# Patient Record
Sex: Male | Born: 1950 | Race: Black or African American | Hispanic: No | Smoking: Former smoker
Health system: Southern US, Community
[De-identification: ages and names within clinical notes are randomized; demographics above are authoritative.]

## PROBLEM LIST (undated history)

## (undated) DIAGNOSIS — I340 Nonrheumatic mitral (valve) insufficiency: Secondary | ICD-10-CM

## (undated) DIAGNOSIS — J329 Chronic sinusitis, unspecified: Secondary | ICD-10-CM

## (undated) DIAGNOSIS — E88819 Insulin resistance, unspecified: Secondary | ICD-10-CM

## (undated) DIAGNOSIS — Z8546 Personal history of malignant neoplasm of prostate: Secondary | ICD-10-CM

## (undated) DIAGNOSIS — I10 Essential (primary) hypertension: Secondary | ICD-10-CM

## (undated) DIAGNOSIS — I493 Ventricular premature depolarization: Secondary | ICD-10-CM

## (undated) DIAGNOSIS — M1990 Unspecified osteoarthritis, unspecified site: Secondary | ICD-10-CM

## (undated) DIAGNOSIS — F439 Reaction to severe stress, unspecified: Secondary | ICD-10-CM

## (undated) DIAGNOSIS — H1010 Acute atopic conjunctivitis, unspecified eye: Secondary | ICD-10-CM

## (undated) DIAGNOSIS — E8881 Metabolic syndrome: Secondary | ICD-10-CM

## (undated) HISTORY — DX: Insulin resistance, unspecified: E88.819

## (undated) HISTORY — DX: Essential (primary) hypertension: I10

## (undated) HISTORY — DX: Personal history of malignant neoplasm of prostate: Z85.46

## (undated) HISTORY — DX: Ventricular premature depolarization: I49.3

## (undated) HISTORY — DX: Reaction to severe stress, unspecified: F43.9

## (undated) HISTORY — DX: Acute atopic conjunctivitis, unspecified eye: H10.10

## (undated) HISTORY — DX: Metabolic syndrome: E88.81

## (undated) HISTORY — DX: Chronic sinusitis, unspecified: J32.9

## (undated) HISTORY — DX: Nonrheumatic mitral (valve) insufficiency: I34.0

## (undated) HISTORY — DX: Unspecified osteoarthritis, unspecified site: M19.90

---

## 1997-10-30 ENCOUNTER — Emergency Department (HOSPITAL_COMMUNITY): Admission: EM | Admit: 1997-10-30 | Discharge: 1997-10-30 | Payer: Self-pay | Admitting: Emergency Medicine

## 2006-09-18 ENCOUNTER — Emergency Department (HOSPITAL_COMMUNITY): Admission: EM | Admit: 2006-09-18 | Discharge: 2006-09-18 | Payer: Self-pay | Admitting: Emergency Medicine

## 2006-09-19 ENCOUNTER — Ambulatory Visit: Payer: Self-pay | Admitting: Vascular Surgery

## 2006-09-19 ENCOUNTER — Ambulatory Visit (HOSPITAL_COMMUNITY): Admission: RE | Admit: 2006-09-19 | Discharge: 2006-09-19 | Payer: Self-pay | Admitting: Emergency Medicine

## 2008-12-06 ENCOUNTER — Encounter: Admission: RE | Admit: 2008-12-06 | Discharge: 2008-12-06 | Payer: Self-pay | Admitting: Internal Medicine

## 2010-02-28 ENCOUNTER — Other Ambulatory Visit: Payer: Self-pay | Admitting: Internal Medicine

## 2010-02-28 ENCOUNTER — Ambulatory Visit (HOSPITAL_COMMUNITY)
Admission: RE | Admit: 2010-02-28 | Discharge: 2010-02-28 | Payer: Self-pay | Source: Home / Self Care | Attending: Internal Medicine | Admitting: Internal Medicine

## 2011-03-09 ENCOUNTER — Other Ambulatory Visit: Payer: Self-pay | Admitting: Internal Medicine

## 2011-07-04 ENCOUNTER — Other Ambulatory Visit: Payer: Self-pay | Admitting: Internal Medicine

## 2012-07-06 ENCOUNTER — Encounter: Payer: Self-pay | Admitting: Hematology

## 2013-07-15 ENCOUNTER — Other Ambulatory Visit: Payer: Self-pay | Admitting: Urology

## 2013-07-15 DIAGNOSIS — R972 Elevated prostate specific antigen [PSA]: Secondary | ICD-10-CM

## 2013-07-30 ENCOUNTER — Other Ambulatory Visit (HOSPITAL_COMMUNITY): Payer: Self-pay

## 2013-08-06 ENCOUNTER — Ambulatory Visit (HOSPITAL_COMMUNITY)
Admission: RE | Admit: 2013-08-06 | Discharge: 2013-08-06 | Disposition: A | Discharge status: Home / Self Care | Payer: BC Managed Care – PPO | Source: Ambulatory Visit | Attending: Urology | Admitting: Urology

## 2013-08-06 DIAGNOSIS — R972 Elevated prostate specific antigen [PSA]: Secondary | ICD-10-CM

## 2013-08-06 DIAGNOSIS — N400 Enlarged prostate without lower urinary tract symptoms: Secondary | ICD-10-CM | POA: Insufficient documentation

## 2013-08-06 LAB — CREATININE, SERUM
Creatinine, Ser: 1.09 mg/dL (ref 0.50–1.35)
GFR calc non Af Amer: 70 mL/min — ABNORMAL LOW (ref >90)
GFR, EST AFRICAN AMERICAN: 81 mL/min — AB (ref >90)

## 2013-08-06 MED ORDER — GADOBENATE DIMEGLUMINE 529 MG/ML IV SOLN
17.0000 mL | Freq: Once | INTRAVENOUS | Status: AC | PRN
  Administered 2013-08-06: 17 mL via INTRAVENOUS

## 2015-04-17 ENCOUNTER — Ambulatory Visit (HOSPITAL_COMMUNITY): Payer: Managed Care, Other (non HMO)

## 2017-05-16 ENCOUNTER — Other Ambulatory Visit: Payer: Self-pay | Admitting: Urology

## 2019-03-17 ENCOUNTER — Ambulatory Visit: Payer: Managed Care, Other (non HMO) | Attending: Internal Medicine

## 2019-03-17 DIAGNOSIS — Z23 Encounter for immunization: Secondary | ICD-10-CM | POA: Diagnosis not present

## 2019-03-17 NOTE — Progress Notes (Signed)
   Covid-19 Vaccination Clinic  Name:  Gabriel Benson    MRN: [id number] DOB: 05/04/1950  03/17/2019  Mr. Gabriel Benson was observed post Covid-19 immunization for 15 minutes without incidence. He was provided with Vaccine Information Sheet and instruction to access the V-Safe system.   Mr. Gabriel Benson was instructed to call 911 with any severe reactions post vaccine: Marland Kitchen Difficulty breathing  . Swelling of your face and throat  . A fast heartbeat  . A bad rash all over your body  . Dizziness and weakness    Immunizations Administered    Name Date Dose VIS Date Route   Pfizer COVID-19 Vaccine 03/17/2019 10:53 AM 0.3 mL 02/05/2019 Intramuscular   Manufacturer: [employer], [employer]   Lot: [id number]   NDC: [address]-2

## 2019-04-07 ENCOUNTER — Ambulatory Visit: Payer: Managed Care, Other (non HMO) | Attending: Internal Medicine

## 2019-04-07 DIAGNOSIS — Z23 Encounter for immunization: Secondary | ICD-10-CM | POA: Insufficient documentation

## 2019-04-07 NOTE — Progress Notes (Signed)
   Covid-19 Vaccination Clinic  Name:  Gabriel Benson    MRN: [id number] DOB: 1950-06-20  04/07/2019  Gabriel Benson was observed post Covid-19 immunization for 15 minutes without incidence. He was provided with Vaccine Information Sheet and instruction to access the V-Safe system.   Gabriel Benson was instructed to call 911 with any severe reactions post vaccine: Marland Kitchen Difficulty breathing  . Swelling of your face and throat  . A fast heartbeat  . A bad rash all over your body  . Dizziness and weakness    Immunizations Administered    Name Date Dose VIS Date Route   Pfizer COVID-19 Vaccine 04/07/2019  2:36 PM 0.3 mL 02/05/2019 Intramuscular   Manufacturer: [employer], [employer]   Lot: [id number]   NDC: [address]-8

## 2019-05-07 ENCOUNTER — Other Ambulatory Visit: Payer: Self-pay | Admitting: Internal Medicine

## 2019-05-07 DIAGNOSIS — I714 Abdominal aortic aneurysm, without rupture, unspecified: Secondary | ICD-10-CM

## 2019-05-19 ENCOUNTER — Other Ambulatory Visit: Payer: Self-pay | Admitting: Internal Medicine

## 2019-05-19 ENCOUNTER — Ambulatory Visit
Admission: RE | Admit: 2019-05-19 | Discharge: 2019-05-19 | Disposition: A | Discharge status: Home / Self Care | Payer: Managed Care, Other (non HMO) | Source: Ambulatory Visit | Attending: Internal Medicine | Admitting: Internal Medicine

## 2019-05-19 DIAGNOSIS — I714 Abdominal aortic aneurysm, without rupture, unspecified: Secondary | ICD-10-CM

## 2019-09-03 ENCOUNTER — Other Ambulatory Visit: Payer: Self-pay | Admitting: Internal Medicine

## 2019-09-03 ENCOUNTER — Ambulatory Visit
Admission: RE | Admit: 2019-09-03 | Discharge: 2019-09-03 | Disposition: A | Discharge status: Home / Self Care | Payer: Managed Care, Other (non HMO) | Source: Ambulatory Visit | Attending: Internal Medicine | Admitting: Internal Medicine

## 2019-09-03 ENCOUNTER — Other Ambulatory Visit: Payer: Self-pay

## 2019-09-03 DIAGNOSIS — M542 Cervicalgia: Secondary | ICD-10-CM

## 2019-09-03 DIAGNOSIS — M25551 Pain in right hip: Secondary | ICD-10-CM

## 2021-07-09 ENCOUNTER — Encounter: Payer: Self-pay | Admitting: Cardiology

## 2021-07-09 ENCOUNTER — Ambulatory Visit: Payer: Medicare HMO | Admitting: Cardiology

## 2021-07-09 VITALS — BP 127/85 | HR 77 | Temp 98.4°F | Resp 16 | Ht 69.0 in | Wt 185.0 lb

## 2021-07-09 DIAGNOSIS — R7303 Prediabetes: Secondary | ICD-10-CM

## 2021-07-09 DIAGNOSIS — R011 Cardiac murmur, unspecified: Secondary | ICD-10-CM

## 2021-07-09 DIAGNOSIS — I10 Essential (primary) hypertension: Secondary | ICD-10-CM

## 2021-07-09 DIAGNOSIS — E782 Mixed hyperlipidemia: Secondary | ICD-10-CM

## 2021-07-09 NOTE — Progress Notes (Signed)
? ?ID:  Gabriel Benson, DOB [birth year], MRN [id number] ? ?PCP:  Gwenyth Bender, MD  ?Cardiologist:  Tessa Lerner, DO, [facility] (established care Jul 09, 2021) ? ? ?REASON FOR CONSULT: Cardiac murmur ? ?REQUESTING PHYSICIAN:  ?Gwenyth Bender, MD ?[address] ?[address] ?[place],  [place] [address] ? ?Chief Complaint  ?Patient presents with  ? Heart Murmur  ? New Patient (Initial Visit)  ? ? ?HPI  ?Gabriel Benson is a 71 y.o. African-American male who presents to the clinic for evaluation of cardiac murmur at the request of Gwenyth Bender, MD. His past medical history and cardiovascular risk factors include: Hypertension, mitral regurgitation, prediabetes, hyperlipidemia.  ? ?Patient presents to the office today to be evaluated for cardiac murmur at the request of his PCP.  He has no prior recollection of having a cardiac murmur.  However, review of prior records from 2012 notes that he was noted to have mild MR on surface echocardiogram.  He ambulates on a daily basis 1-1.5 miles without any exertional chest pain or heart failure symptoms. ? ?His prior LVEF was documented to be 45-50%.  He is currently not on heart failure medications and does not endorse such comorbid conditions in the past.  He is in the process of obtaining [facility] coverage as well. ? ?FUNCTIONAL STATUS: ?Walks 1-1.5 miles regularly 5 days a week. ? ?ALLERGIES: ?No Known Allergies ? ?MEDICATION LIST PRIOR TO VISIT: ?Current Meds  ?Medication Sig  ? Acetaminophen (TYLENOL ARTHRITIS PAIN PO) Take by mouth.  ? aspirin 325 MG tablet Take 325 mg by mouth daily.  ? Cyanocobalamin (VITAMIN B-12 PO) Take by mouth.  ? FELODIPINE ER PO Take by mouth daily.  ? Fexofenadine HCl (ALLEGRA PO) Take by mouth.  ? GLUCOSAMINE PO Take by mouth.  ? magnesium oxide (MAG-OX) 400 (240 Mg) MG tablet Take 1 tablet by mouth 2 (two) times daily as needed.  ? omeprazole (PRILOSEC) 20 MG capsule Take 20 mg by mouth daily.  ? terazosin (HYTRIN) 5 MG capsule Take 5 mg by mouth at bedtime.   ? TRIAMTERENE-HCTZ PO Take by mouth daily.  ?  ? ?PAST MEDICAL HISTORY: ?Past Medical History:  ?Diagnosis Date  ? Allergic conjunctivitis   ? DJD (degenerative joint disease)   ? H/O prostate cancer   ? Hypertension   ? Insulin resistance   ? Mitral valve regurgitation   ? Sinusitis   ? Situational stress   ? ? ?PAST SURGICAL HISTORY: ?History reviewed. No pertinent surgical history. ? ?FAMILY HISTORY: ?The patient family history includes Hypertension in his father. ? ?SOCIAL HISTORY:  ?The patient  reports that he has quit smoking. His smoking use included cigarettes. He has a 1.00 pack-year smoking history. He has never used smokeless tobacco. He reports current alcohol use. He reports that he does not use drugs. ? ?REVIEW OF SYSTEMS: ?Review of Systems  ?Cardiovascular:  Negative for chest pain, claudication, dyspnea on exertion, leg swelling, near-syncope, orthopnea, palpitations, paroxysmal nocturnal dyspnea and syncope.  ?Respiratory:  Negative for shortness of breath.   ?Hematologic/Lymphatic: Negative for bleeding problem.  ? ?PHYSICAL EXAM: ? ?  07/09/2021  ? 12:26 PM 05/18/2012  ?  7:50 PM 11/18/2011  ?  7:51 PM  ?Vitals with BMI  ?Height 5\' 9"     ?Weight 185 lbs  187 lbs  ?BMI 27.31    ?Systolic 127 124  ?Diastolic 85 83 85  ?Pulse 77  70  ? ? ?CONSTITUTIONAL: Well-developed and well-nourished. No  acute distress.  ?SKIN: Skin is warm and dry. No rash noted. No cyanosis. No pallor. No jaundice ?HEAD: Normocephalic and atraumatic.  ?EYES: No scleral icterus ?MOUTH/THROAT: Moist oral membranes.  ?NECK: No JVD present. No thyromegaly noted. No carotid bruits  ?LYMPHATIC: No visible cervical adenopathy.  ?CHEST Normal respiratory effort. No intercostal retractions  ?LUNGS: Clear to auscultation bilaterally.  No stridor. No wheezes. No rales.  ?CARDIOVASCULAR: Regular rate and rhythm, positive S1-S2, soft systolic murmur, no rubs or gallops appreciated. ?ABDOMINAL: ?Obese, soft, nontender, nondistended,  positive bowel sounds in all 4 quadrants, no apparent ascites.  ?EXTREMITIES: No peripheral edema, warm to touch, 2+ bilateral DP and PT pulses ?HEMATOLOGIC: No significant bruising ?NEUROLOGIC: Oriented to person, place, and time. Nonfocal. Normal muscle tone.  ?PSYCHIATRIC: Normal mood and affect. Normal behavior. Cooperative ? ?CARDIAC DATABASE: ?EKG: ?07/09/2021: Normal sinus rhythm, 70 bpm, without underlying injury pattern. ? ?Echocardiogram: ?January 2012: ?LVEF 45-50%, grade 1 diastolic impairment, mild MR, mildly dilated right ventricle. ? ?Stress Testing: ?No results found for this or any previous visit from the past 1095 days. ? ?Heart Catheterization: ?None ? ?LABORATORY DATA: ?External Labs: ?Collected: Jun 25, 2021 provided by PCP ?Total cholesterol 195, HDL 53, triglycerides 137, LDL 117, non-HDL 142 ?BUN 10, creatinine 0.99 ?Sodium 141, potassium 4, chloride 103, bicarb 29 ?AST 24, ALT 22, alkaline phosphatase 48 ?Hemoglobin 15 g/dL, hematocrit 09%45% ? ?Collected: 02/02/2021 ?NT proBNP 27. ?Total cholesterol 211, HDL 63, triglycerides 98, LDL 128, non-HDL 148 ? ? ?IMPRESSION: ? ?  ICD-10-CM   ?1. Heart murmur  R01.1 EKG 12-Lead  ?  PCV ECHOCARDIOGRAM COMPLETE  ?  ?2. Benign hypertension  I10   ?  ?3. Mixed hyperlipidemia  E78.2 CT CARDIAC SCORING ([facility] LOCATIONS ONLY)  ?  ?4. Prediabetes  R73.03   ?  ?  ? ?RECOMMENDATIONS: ?Gabriel Benson is a 71 y.o. African-American male whose past medical history and cardiac risk factors include: Hypertension, mitral regurgitation, prediabetes, hyperlipidemia.  ? ?Patient referred to the office for evaluation of cardiac murmur which is evident on physical examination.  Recommend echocardiogram to evaluate LVEF, diastolic function and valvular heart disease. ? ?Today's EKG is nonischemic results reviewed with the patient as well. ? ?Given the patient's cholesterol levels and estimated 10-year risk of ASCVD of 19.5% recommended considering cholesterol medications.   Patient would like to discuss this further with PCP which is very reasonable.  In the meantime we discussed considering coronary artery calcium score as a risk stratification tool.  Patient agrees. ? ?Denies anginal discomfort or heart failure symptoms.  Patient has good functional capacity for age.  We will hold off on ischemic work-up for now based on the objective data available.  Patient is agreeable with the plan of care. ? ?Patient states that he will have the diagnostic work-up performed in August 2023 as he has upcoming vacations planned. ? ?As part of this consultation reviewed prior echo reports, office notes and labs provided by PCP independently reviewed and summarized above for reference, ordered and independently reviewed EKG, plan of care discussed and additional diagnostic work-up ordered as noted below. ? ?FINAL MEDICATION LIST END OF ENCOUNTER: ?No orders of the defined types were placed in this encounter. ?  ?Medications Discontinued During This Encounter  ?Medication Reason  ? MAGNESIUM PO   ? TERAZOSIN HCL PO   ? terazosin (HYTRIN) 10 MG capsule   ?  ? ?Current Outpatient Medications:  ?  Acetaminophen (TYLENOL ARTHRITIS PAIN PO), Take by mouth., Disp: ,  Rfl:  ?  aspirin 325 MG tablet, Take 325 mg by mouth daily., Disp: , Rfl:  ?  Cyanocobalamin (VITAMIN B-12 PO), Take by mouth., Disp: , Rfl:  ?  FELODIPINE ER PO, Take by mouth daily., Disp: , Rfl:  ?  Fexofenadine HCl (ALLEGRA PO), Take by mouth., Disp: , Rfl:  ?  GLUCOSAMINE PO, Take by mouth., Disp: , Rfl:  ?  magnesium oxide (MAG-OX) 400 (240 Mg) MG tablet, Take 1 tablet by mouth 2 (two) times daily as needed., Disp: , Rfl:  ?  omeprazole (PRILOSEC) 20 MG capsule, Take 20 mg by mouth daily., Disp: , Rfl:  ?  terazosin (HYTRIN) 5 MG capsule, Take 5 mg by mouth at bedtime., Disp: , Rfl:  ?  TRIAMTERENE-HCTZ PO, Take by mouth daily., Disp: , Rfl:  ? ?Orders Placed This Encounter  ?Procedures  ? CT CARDIAC SCORING ([facility] LOCATIONS ONLY)  ? EKG  12-Lead  ? PCV ECHOCARDIOGRAM COMPLETE  ? ? ?There are no Patient Instructions on file for this visit.  ? ?--Continue cardiac medications as reconciled in final medication list. ?--Return in about 3 months (aroun

## 2021-08-20 ENCOUNTER — Ambulatory Visit
Admission: RE | Admit: 2021-08-20 | Discharge: 2021-08-20 | Disposition: A | Discharge status: Home / Self Care | Payer: No Typology Code available for payment source | Source: Ambulatory Visit | Attending: Cardiology | Admitting: Cardiology

## 2021-08-20 DIAGNOSIS — E782 Mixed hyperlipidemia: Secondary | ICD-10-CM

## 2021-08-29 ENCOUNTER — Telehealth: Payer: Self-pay

## 2021-09-03 NOTE — Telephone Encounter (Signed)
Called the patient and discussed his coronary calcium report. He is made aware that the total calcium score is 0. He does have a pulmonary nodule which is stable compared to his prior studies.   Further work-up is less likely but I still have asked him to discuss it with his PCP.  Kato Wieczorek [place], DO, [facility]

## 2021-10-01 ENCOUNTER — Other Ambulatory Visit: Payer: Medicare HMO

## 2021-10-05 ENCOUNTER — Ambulatory Visit: Payer: Medicare HMO

## 2021-10-05 DIAGNOSIS — R011 Cardiac murmur, unspecified: Secondary | ICD-10-CM

## 2021-10-12 ENCOUNTER — Ambulatory Visit: Payer: Medicare HMO | Admitting: Cardiology

## 2021-10-12 ENCOUNTER — Encounter: Payer: Self-pay | Admitting: Cardiology

## 2021-10-12 VITALS — BP 134/85 | HR 58 | Temp 98.0°F | Resp 16 | Ht 69.0 in | Wt 185.0 lb

## 2021-10-12 DIAGNOSIS — I10 Essential (primary) hypertension: Secondary | ICD-10-CM

## 2021-10-12 DIAGNOSIS — I700 Atherosclerosis of aorta: Secondary | ICD-10-CM

## 2021-10-12 DIAGNOSIS — I340 Nonrheumatic mitral (valve) insufficiency: Secondary | ICD-10-CM

## 2021-10-12 DIAGNOSIS — E782 Mixed hyperlipidemia: Secondary | ICD-10-CM

## 2021-10-12 DIAGNOSIS — R7303 Prediabetes: Secondary | ICD-10-CM

## 2021-10-12 NOTE — Progress Notes (Signed)
ID:  Gabriel Benson, DOB [birth year], MRN [id number]  PCP:  Gwenyth Bender, MD  Cardiologist:  Tessa Lerner, DO, [facility] (established care Jul 09, 2021)  Date: 10/12/21 Last Office Visit: 07/09/2021  Chief Complaint  Patient presents with   Follow-up    3 month   Results    Lab result    HPI  Gabriel Benson is a 71 y.o. African-American male whose past medical history and cardiovascular risk factors include: Aortic atherosclerosis, hypertension, mitral regurgitation, prediabetes, hyperlipidemia.   Patient was referred to the practice for evaluation of a cardiac murmur.  At the last office visit the shared decision was to proceed with echocardiogram to evaluate for LVEF, valvular heart disease, and diastolic function.  In addition given his elevated 10-year risk of ASCVD and LDL levels he was recommended to be on statin therapy but wanted to discuss it further with PCP.  For further risk stratification shared decision was to proceed with coronary calcium score.  Since last office visit he has not had any anginal discomfort or heart failure symptoms.  He was started on Crestor 5 mg p.o. nightly by his PCP. Total coronary calcium score is 0.  Patient is noted to have preserved LVEF with mild to moderate valvular heart disease.  FUNCTIONAL STATUS: Walks 1-1.5 miles regularly 5 days a week.  ALLERGIES: No Known Allergies  MEDICATION LIST PRIOR TO VISIT: Current Meds  Medication Sig   Acetaminophen (TYLENOL ARTHRITIS PAIN PO) Take by mouth.   Cyanocobalamin (VITAMIN B-12 PO) Take by mouth.   FELODIPINE ER PO Take by mouth daily.   Fexofenadine HCl (ALLEGRA PO) Take by mouth.   finasteride (PROSCAR) 5 MG tablet Take 5 mg by mouth daily.   GLUCOSAMINE PO Take by mouth.   magnesium oxide (MAG-OX) 400 (240 Mg) MG tablet Take 1 tablet by mouth 2 (two) times daily as needed.   omeprazole (PRILOSEC) 20 MG capsule Take 20 mg by mouth daily.   QUEtiapine (SEROQUEL) 25 MG tablet Take 25 mg by  mouth at bedtime.   rosuvastatin (CRESTOR) 5 MG tablet Take 5 mg by mouth daily.   terazosin (HYTRIN) 5 MG capsule Take 5 mg by mouth at bedtime.   TRIAMTERENE-HCTZ PO Take by mouth daily.     PAST MEDICAL HISTORY: Past Medical History:  Diagnosis Date   Allergic conjunctivitis    DJD (degenerative joint disease)    H/O prostate cancer    Hypertension    Insulin resistance    Mitral valve regurgitation    Sinusitis    Situational stress     PAST SURGICAL HISTORY: History reviewed. No pertinent surgical history.  FAMILY HISTORY: The patient family history includes Hypertension in his father.  SOCIAL HISTORY:  The patient  reports that he has quit smoking. His smoking use included cigarettes. He has a 1.00 pack-year smoking history. He has never used smokeless tobacco. He reports current alcohol use. He reports that he does not use drugs.  REVIEW OF SYSTEMS: Review of Systems  Cardiovascular:  Negative for chest pain, claudication, dyspnea on exertion, leg swelling, near-syncope, orthopnea, palpitations, paroxysmal nocturnal dyspnea and syncope.  Respiratory:  Negative for shortness of breath.   Hematologic/Lymphatic: Negative for bleeding problem.    PHYSICAL EXAM:    10/12/2021   10:36 AM 07/09/2021   12:26 PM 05/18/2012    7:50 PM  Vitals with BMI  Height 5\' 9"  5\' 9"    Weight 185 lbs 185 lbs   BMI 27.31 27.31  Systolic 134 127 412  Diastolic 85 85 83  Pulse 58 77     CONSTITUTIONAL: Well-developed and well-nourished. No acute distress.  SKIN: Skin is warm and dry. No rash noted. No cyanosis. No pallor. No jaundice HEAD: Normocephalic and atraumatic.  EYES: No scleral icterus MOUTH/THROAT: Moist oral membranes.  NECK: No JVD present. No thyromegaly noted. No carotid bruits  LYMPHATIC: No visible cervical adenopathy.  CHEST Normal respiratory effort. No intercostal retractions  LUNGS: Clear to auscultation bilaterally.  No stridor. No wheezes. No rales.   CARDIOVASCULAR: Regular rate and rhythm, positive S1-S2, soft systolic murmur, no rubs or gallops appreciated. ABDOMINAL: Obese, soft, nontender, nondistended, positive bowel sounds in all 4 quadrants, no apparent ascites.  EXTREMITIES: No peripheral edema, warm to touch, 2+ bilateral DP and PT pulses HEMATOLOGIC: No significant bruising NEUROLOGIC: Oriented to person, place, and time. Nonfocal. Normal muscle tone.  PSYCHIATRIC: Normal mood and affect. Normal behavior. Cooperative  CARDIAC DATABASE: EKG: 07/09/2021: Normal sinus rhythm, 70 bpm, without underlying injury pattern.  Echocardiogram: January 2012: LVEF 45-50%, grade 1 diastolic impairment, mild MR, mildly dilated right ventricle.  10/05/2021: Normal LV systolic function with visual EF 55-60%. Left ventricle cavity is normal in size. Normal left ventricular wall thickness. Normal global wall motion. Doppler evidence of grade I (impaired) diastolic dysfunction, elevated LAP.  Left atrial cavity is moderately dilated. 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 Mild (Grade I) mitral regurgitation. Moderate tricuspid regurgitation. Mild to moderate pulmonary hypertension. RVSP measures 41 mmHg. Mild pulmonic regurgitation.  Stress Testing: No results found for this or any previous visit from the past 1095 days.  Heart Catheterization: None  CT Cardiac Scoring: 07/2021 Total CAC 0 AU. Noncardiac findings:  7 mm right lower lobe pulmonary nodule, unchanged since 02/16/2018 and considered benign. Aortic Atherosclerosis (ICD10-I70.0).  LABORATORY DATA: External Labs: Collected: Jun 25, 2021 provided by PCP Total cholesterol 195, HDL 53, triglycerides 137, LDL 117, non-HDL 142 BUN 10, creatinine 0.99 Sodium 141, potassium 4, chloride 103, bicarb 29 AST 24, ALT 22, alkaline phosphatase 48 Hemoglobin 15 g/dL, hematocrit 87%  Collected: 02/02/2021 NT proBNP 27. Total cholesterol 211, HDL 63,  triglycerides 98, LDL 128, non-HDL 148   IMPRESSION:    ICD-10-CM   1. Mild mitral regurgitation  I34.0     2. Atherosclerosis of aorta (HCC)  I70.0     3. Benign hypertension  I10     4. Mixed hyperlipidemia  E78.2     5. Prediabetes  R73.03        RECOMMENDATIONS: Gabriel Benson is a 72 y.o. African-American male whose past medical history and cardiac risk factors include: Aortic atherosclerosis, hypertension, mitral regurgitation, prediabetes, hyperlipidemia.   Mild mitral regurgitation Asymptomatic. We will repeat echocardiogram in 3 years for follow-up. Due to limitations of electronic medical records Ordered 3 years out.  Patient is aware of this recommendation and I have asked him to follow through.  Benign hypertension Office blood pressures are well controlled. Medications reconciled. No changes warranted at this time  Mixed hyperlipidemia Currently on rosuvastatin.   He denies myalgia or other side effects. Most recent lipids dated May 2023, independently reviewed as noted above. Currently managed by primary care provider.  Independently reviewed results of the echocardiogram, coronary calcium score, reconciliation of medications, discussed management of at least 2 chronic comorbid conditions.  Shared decision was to follow-up annually after his follow-up visit.  FINAL MEDICATION LIST END OF ENCOUNTER: No orders of the defined types  were placed in this encounter.   Medications Discontinued During This Encounter  Medication Reason   aspirin 325 MG tablet      Current Outpatient Medications:    Acetaminophen (TYLENOL ARTHRITIS PAIN PO), Take by mouth., Disp: , Rfl:    Cyanocobalamin (VITAMIN B-12 PO), Take by mouth., Disp: , Rfl:    FELODIPINE ER PO, Take by mouth daily., Disp: , Rfl:    Fexofenadine HCl (ALLEGRA PO), Take by mouth., Disp: , Rfl:    finasteride (PROSCAR) 5 MG tablet, Take 5 mg by mouth daily., Disp: , Rfl:    GLUCOSAMINE PO, Take by  mouth., Disp: , Rfl:    magnesium oxide (MAG-OX) 400 (240 Mg) MG tablet, Take 1 tablet by mouth 2 (two) times daily as needed., Disp: , Rfl:    omeprazole (PRILOSEC) 20 MG capsule, Take 20 mg by mouth daily., Disp: , Rfl:    QUEtiapine (SEROQUEL) 25 MG tablet, Take 25 mg by mouth at bedtime., Disp: , Rfl:    rosuvastatin (CRESTOR) 5 MG tablet, Take 5 mg by mouth daily., Disp: , Rfl:    terazosin (HYTRIN) 5 MG capsule, Take 5 mg by mouth at bedtime., Disp: , Rfl:    TRIAMTERENE-HCTZ PO, Take by mouth daily., Disp: , Rfl:   No orders of the defined types were placed in this encounter.   There are no Patient Instructions on file for this visit.   --Continue cardiac medications as reconciled in final medication list. --Return in about 10 months (around 08/10/2022) for Annual follow up . or sooner if needed. --Continue follow-up with your primary care physician regarding the management of your other chronic comorbid conditions.  Patient's questions and concerns were addressed to his satisfaction. He voices understanding of the instructions provided during this encounter.   This note was created using a voice recognition software as a result there may be grammatical errors inadvertently enclosed that do not reflect the nature of this encounter. Every attempt is made to correct such errors.  Tessa Lerner, [place], [facility]  Pager: [phone] Office: [phone]

## 2021-12-23 IMAGING — CR DG CERVICAL SPINE COMPLETE 4+V
6 series · 6 of 6 positions shown · non-contrast
Comparison: None.

CLINICAL DATA: Neck pain

EXAM:
CERVICAL SPINE - COMPLETE 4+ VIEW

[w cervical spine lat]
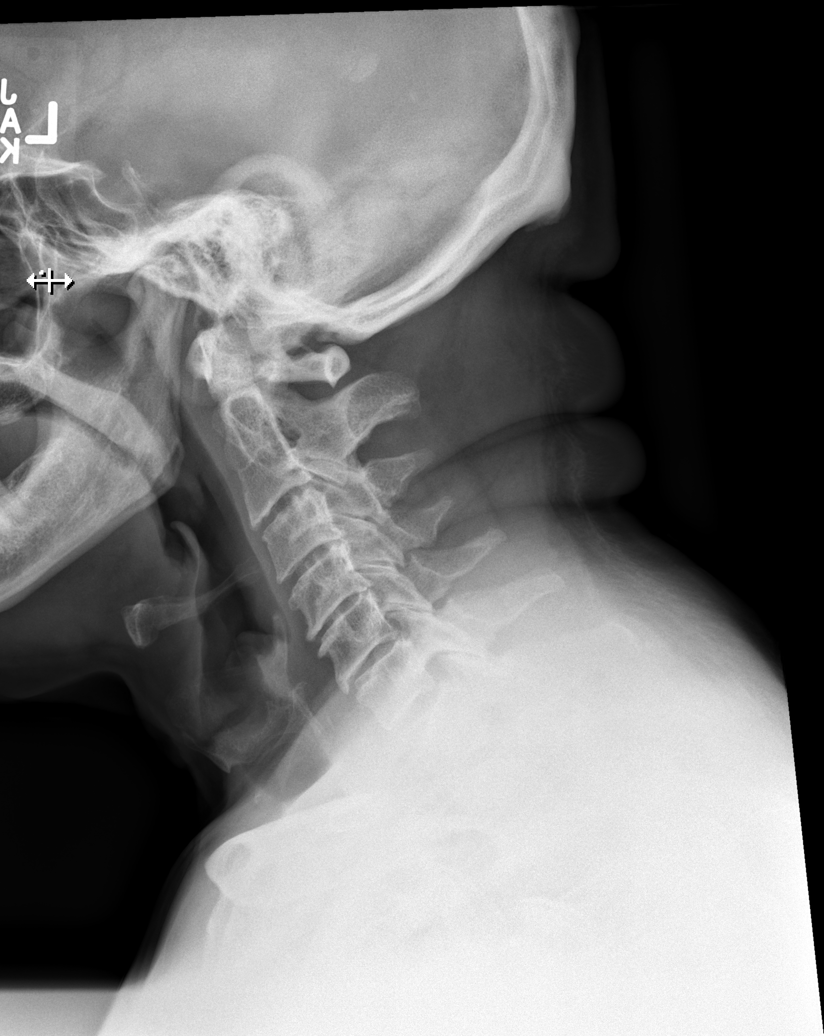

[w cervical spine ap_obl (1 of 2)]
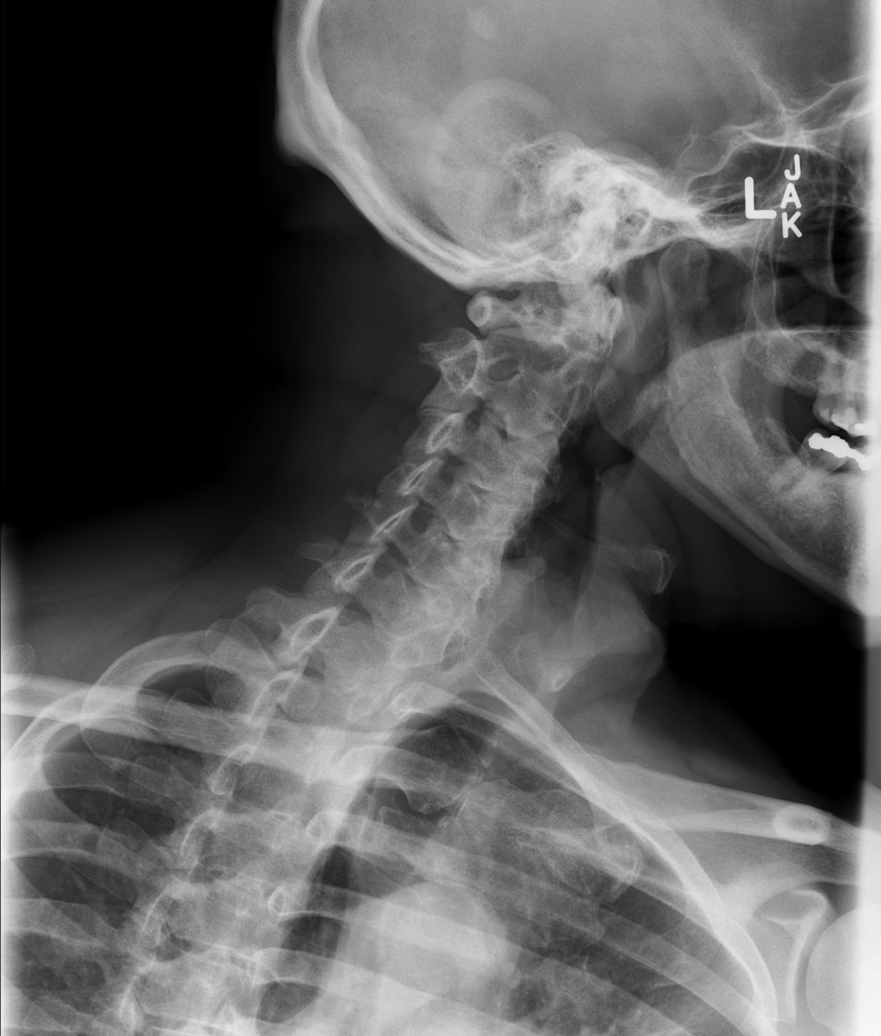

[w cervical spine ap_obl (2 of 2)]
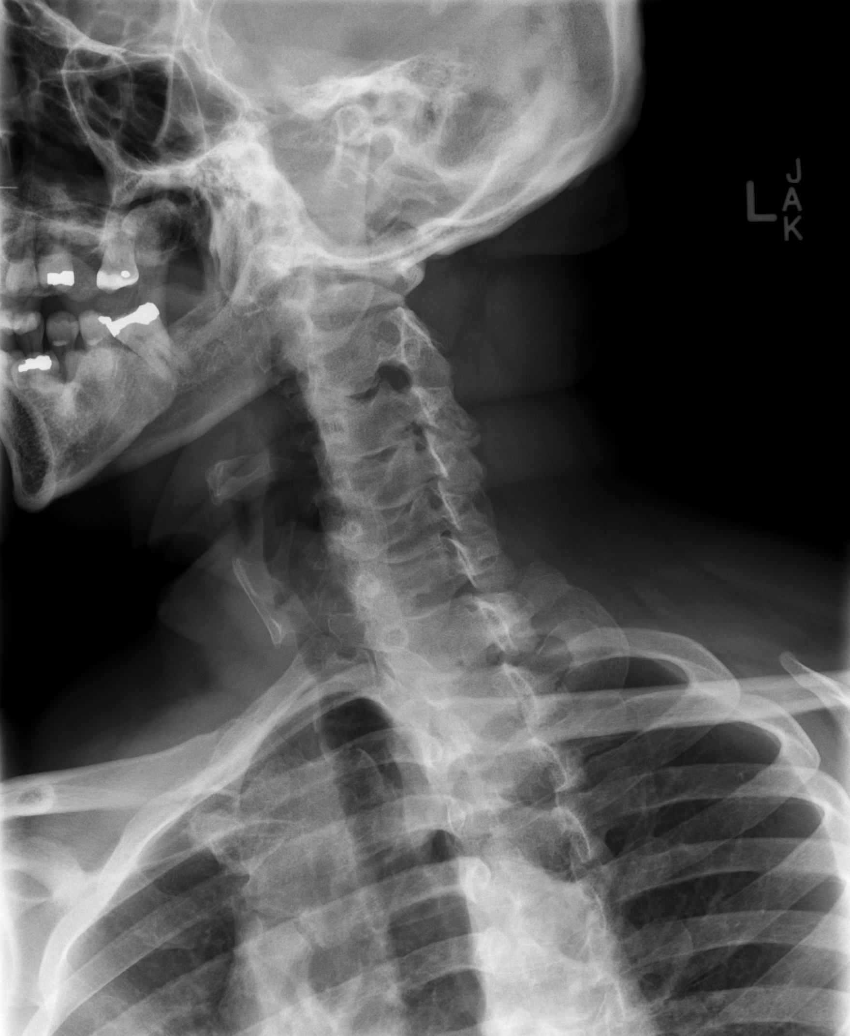

[w cervical spine ap]
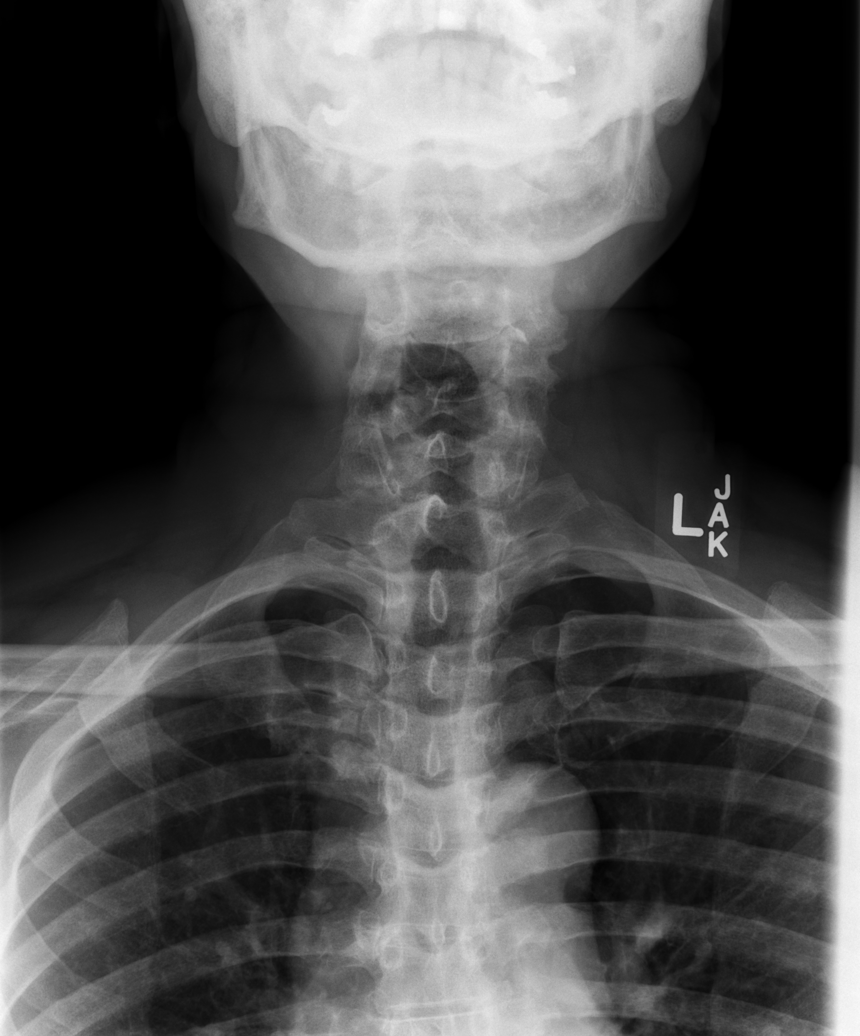

[w cervical spine odontoid (1 of 2)]
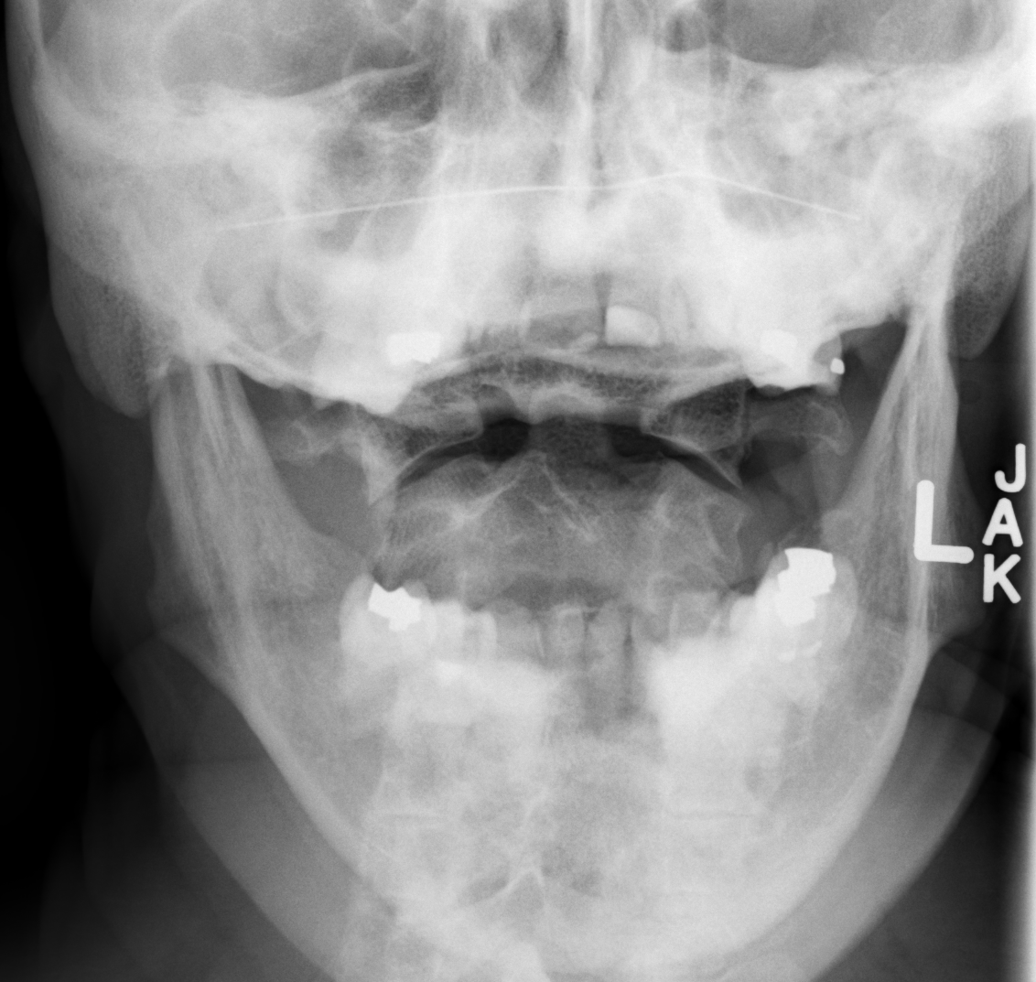

[w cervical spine odontoid (2 of 2)]
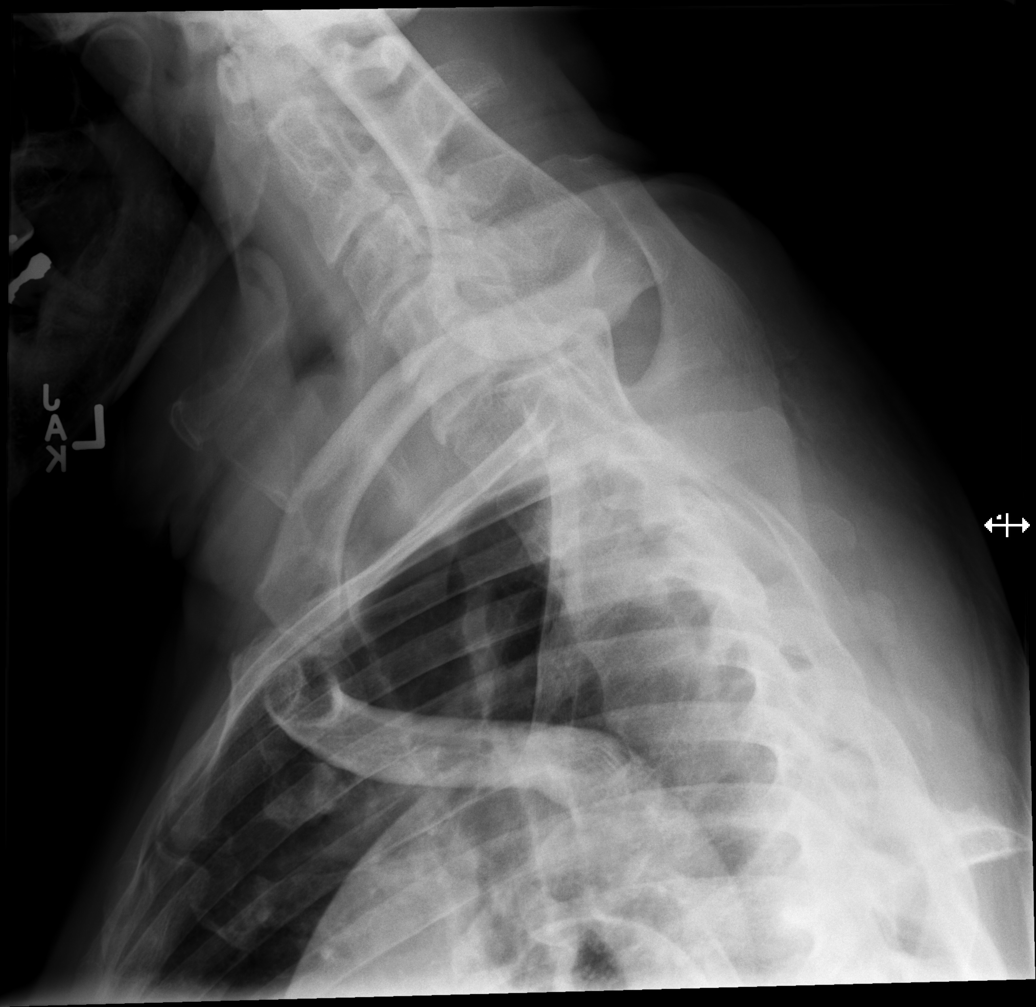

[6 of 6 positions shown; findings below may reference images not displayed]

FINDINGS: There is no prevertebral soft tissue swelling. No acute displaced
fracture. No significant malalignment. Multilevel degenerative
changes are noted throughout the cervical spine resulting in
multilevel bilateral osseous neural foraminal narrowing.
IMPRESSION: No acute displaced fracture or significant malalignment.

## 2021-12-23 IMAGING — CR DG HIP (WITH OR WITHOUT PELVIS) 2-3V*R*
2 series · 2 of 2 positions shown · non-contrast
Comparison: 12/06/2008

CLINICAL DATA: Right hip pain.

EXAM:
DG HIP (WITH OR WITHOUT PELVIS) 2-3V RIGHT

[w hip ap right]
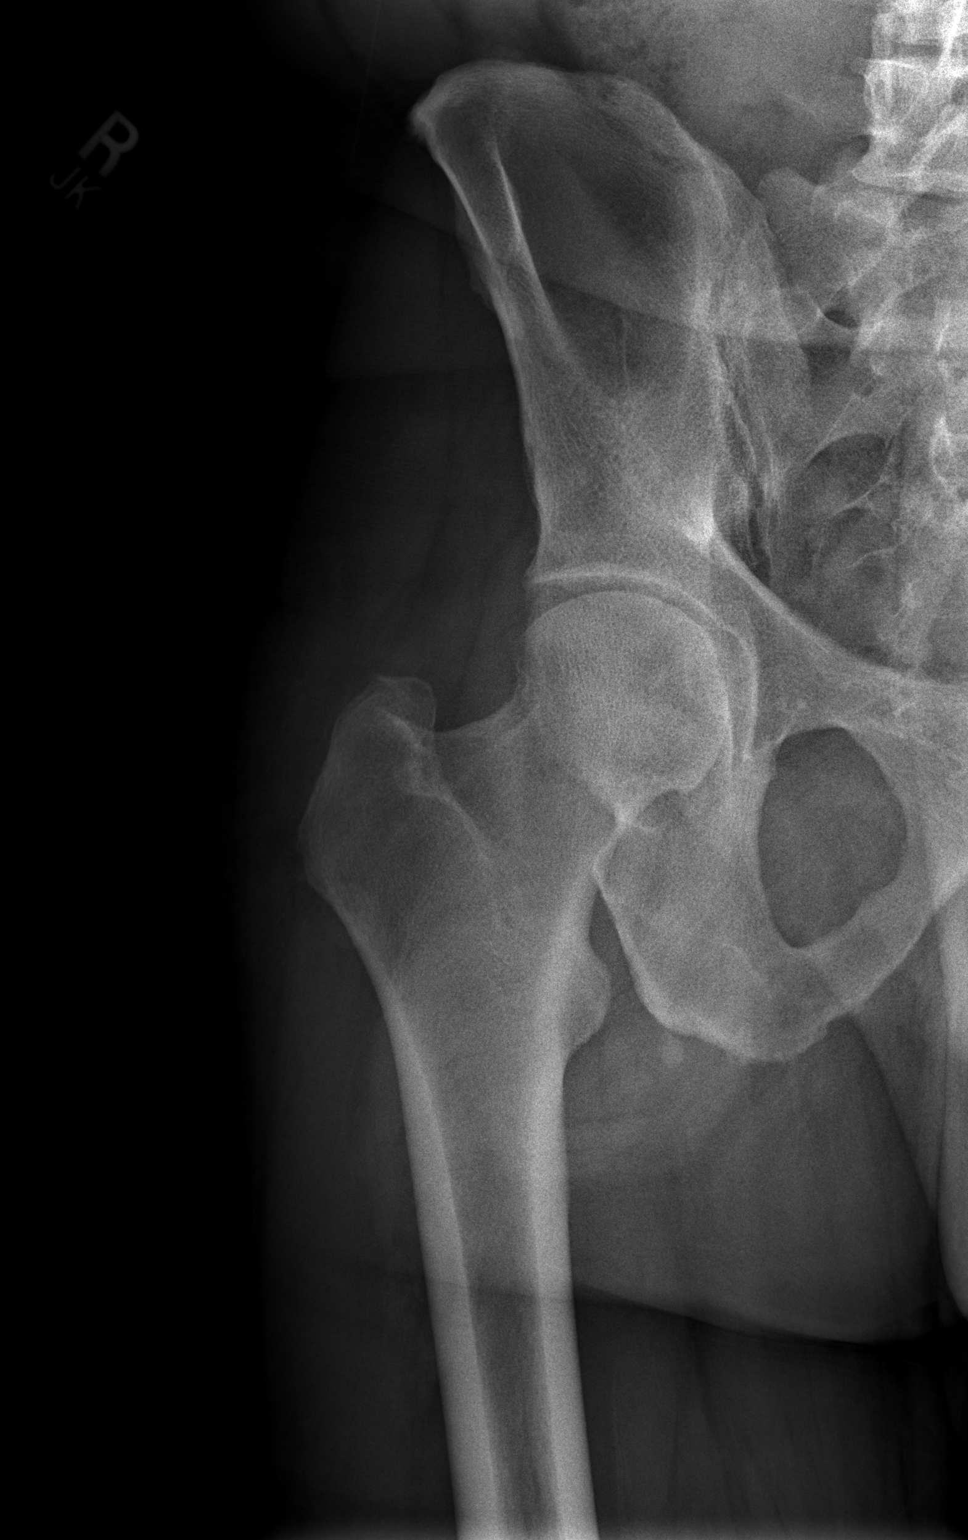

[w hip frog right]
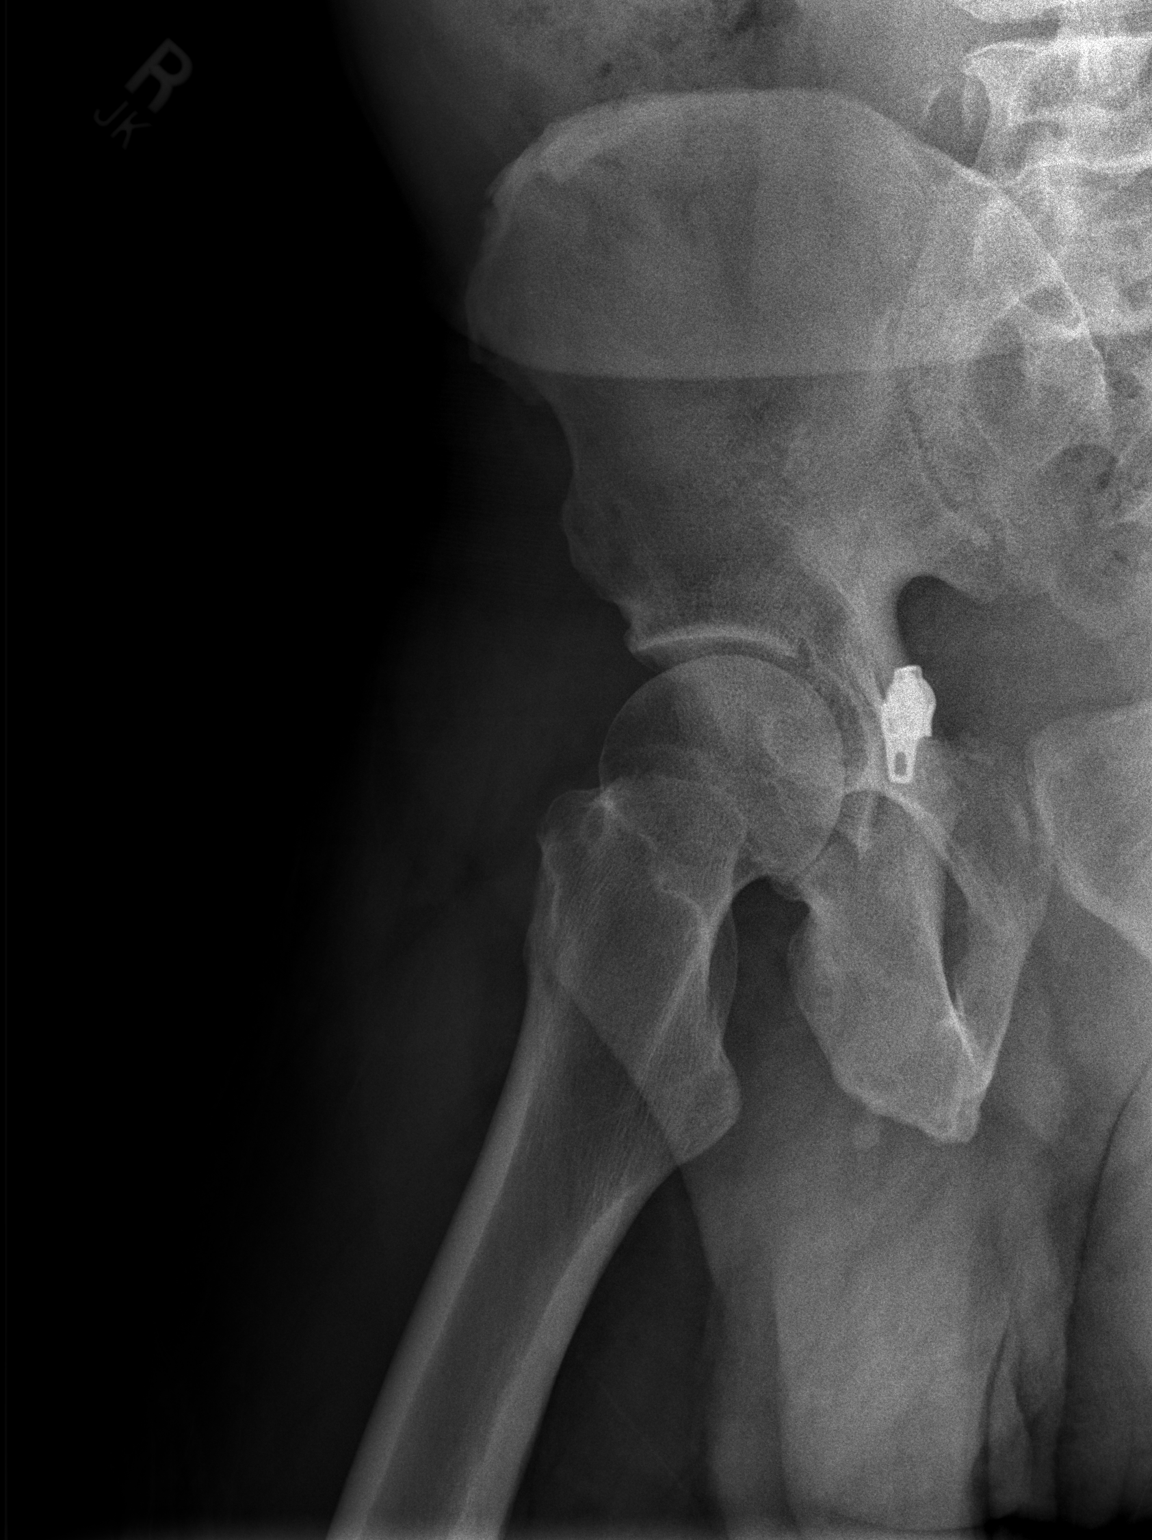

[2 of 2 positions shown; findings below may reference images not displayed]

FINDINGS: There are mild-to-moderate degenerative changes of the right hip.
These have only minimally progressed since 3171. There is no acute
displaced fracture or dislocation.
IMPRESSION: Mild to moderate right hip osteoarthritis.

## 2022-06-24 ENCOUNTER — Ambulatory Visit
Admission: RE | Admit: 2022-06-24 | Discharge: 2022-06-24 | Disposition: A | Discharge status: Home / Self Care | Payer: Medicare HMO | Source: Ambulatory Visit | Attending: Internal Medicine | Admitting: Internal Medicine

## 2022-06-24 ENCOUNTER — Other Ambulatory Visit: Payer: Self-pay | Admitting: Internal Medicine

## 2022-06-24 DIAGNOSIS — M25562 Pain in left knee: Secondary | ICD-10-CM

## 2022-08-12 ENCOUNTER — Encounter: Payer: Self-pay | Admitting: Cardiology

## 2022-08-12 ENCOUNTER — Ambulatory Visit: Payer: Medicare HMO | Admitting: Cardiology

## 2022-08-12 VITALS — BP 138/85 | HR 84 | Ht 69.0 in | Wt 189.0 lb

## 2022-08-12 DIAGNOSIS — I493 Ventricular premature depolarization: Secondary | ICD-10-CM

## 2022-08-12 DIAGNOSIS — R9431 Abnormal electrocardiogram [ECG] [EKG]: Secondary | ICD-10-CM

## 2022-08-12 DIAGNOSIS — R7303 Prediabetes: Secondary | ICD-10-CM

## 2022-08-12 DIAGNOSIS — I700 Atherosclerosis of aorta: Secondary | ICD-10-CM

## 2022-08-12 DIAGNOSIS — E782 Mixed hyperlipidemia: Secondary | ICD-10-CM

## 2022-08-12 DIAGNOSIS — I10 Essential (primary) hypertension: Secondary | ICD-10-CM

## 2022-08-12 DIAGNOSIS — I340 Nonrheumatic mitral (valve) insufficiency: Secondary | ICD-10-CM

## 2022-08-12 NOTE — Progress Notes (Signed)
ID:  [name], DOB 24-Apr-1950, MRN [id number]  PCP:  Gwenyth Bender, MD  Cardiologist:  Tessa Lerner, DO, [facility] (established care Jul 09, 2021)  Date: 08/12/22 Last Office Visit: 10/12/2021  Chief Complaint  Patient presents with   Mild mitral regurgitation   Follow-up    HPI  Gabriel Benson is a 72 y.o. African-American male whose past medical history and cardiovascular risk factors include: Aortic atherosclerosis, hypertension, mitral regurgitation, prediabetes, hyperlipidemia.   Patient presents today for 1 year follow-up visit given his history of mild valvular heart disease.  Since last office visit, denies anginal chest pain or heart failure symptoms.  Intermittently experiences dizziness in the morning, only for about 5 seconds or less.  No near-syncope or syncopal events.  FUNCTIONAL STATUS: Walks 1-1.5 miles regularly 5 days a week.  ALLERGIES: No Known Allergies  MEDICATION LIST PRIOR TO VISIT: Current Meds  Medication Sig   Acetaminophen (TYLENOL ARTHRITIS PAIN PO) Take by mouth.   Cyanocobalamin (VITAMIN B-12 PO) Take by mouth.   FELODIPINE ER PO Take by mouth daily.   Fexofenadine HCl (ALLEGRA PO) Take by mouth.   finasteride (PROSCAR) 5 MG tablet Take 5 mg by mouth daily.   GLUCOSAMINE PO Take by mouth.   magnesium oxide (MAG-OX) 400 (240 Mg) MG tablet Take 1 tablet by mouth 2 (two) times daily as needed.   omeprazole (PRILOSEC) 20 MG capsule Take 20 mg by mouth daily.   QUEtiapine (SEROQUEL) 25 MG tablet Take 25 mg by mouth at bedtime.   rosuvastatin (CRESTOR) 5 MG tablet Take 5 mg by mouth daily.   terazosin (HYTRIN) 5 MG capsule Take 5 mg by mouth at bedtime.   TRIAMTERENE-HCTZ PO Take by mouth daily.     PAST MEDICAL HISTORY: Past Medical History:  Diagnosis Date   Allergic conjunctivitis    DJD (degenerative joint disease)    H/O prostate cancer    Hypertension    Insulin resistance    Mitral valve regurgitation    Sinusitis     Situational stress     PAST SURGICAL HISTORY: History reviewed. No pertinent surgical history.  FAMILY HISTORY: The patient family history includes Hypertension in his father.  SOCIAL HISTORY:  The patient  reports that he has quit smoking. His smoking use included cigarettes. He has a 1.00 pack-year smoking history. He has never used smokeless tobacco. He reports current alcohol use. He reports that he does not use drugs.  REVIEW OF SYSTEMS: Review of Systems  Cardiovascular:  Negative for chest pain, claudication, dyspnea on exertion, leg swelling, near-syncope, orthopnea, palpitations, paroxysmal nocturnal dyspnea and syncope.  Respiratory:  Negative for shortness of breath.   Hematologic/Lymphatic: Negative for bleeding problem.  Neurological:  Positive for dizziness.    PHYSICAL EXAM:    08/12/2022    9:46 AM 10/12/2021   10:36 AM 07/09/2021   12:26 PM  Vitals with BMI  Height 5\' 9"  5\' 9"  5\' 9"   Weight 189 lbs 185 lbs 185 lbs  BMI 27.9 27.31 27.31  Systolic 138 134 409  Diastolic 85 85 85  Pulse 84 58 77    Physical Exam  Constitutional: No distress.  Age appropriate, hemodynamically stable.   Neck: No JVD present.  Cardiovascular: Normal rate, regular rhythm, S1 normal, S2 normal, intact distal pulses and normal pulses. Exam reveals no gallop, no S3 and no S4.  No murmur heard. Pulmonary/Chest: Effort normal and breath sounds normal. No stridor. He has no wheezes. He has  no rales.  Abdominal: Soft. Bowel sounds are normal. He exhibits no distension. There is no abdominal tenderness.  Musculoskeletal:        General: No edema.     Cervical back: Neck supple.  Neurological: He is alert and oriented to person, place, and time. He has intact cranial nerves (2-12).  Skin: Skin is warm and moist.   CARDIAC DATABASE: EKG: August 12, 2022: Sinus rhythm, 83 bpm, frequent PVCs and a ventricular bigeminy pattern.  Without underlying injury pattern.  Echocardiogram: January  2012: LVEF 45-50%, grade 1 diastolic impairment, mild MR, mildly dilated right ventricle.  10/05/2021: Normal LV systolic function with visual EF 55-60%. Left ventricle cavity is normal in size. Normal left ventricular wall thickness. Normal global wall motion. Doppler evidence of grade I (impaired) diastolic dysfunction, elevated LAP.  Left atrial cavity is moderately dilated. 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 Mild (Grade I) mitral regurgitation. Moderate tricuspid regurgitation. Mild to moderate pulmonary hypertension. RVSP measures 41 mmHg. Mild pulmonic regurgitation.  Stress Testing: No results found for this or any previous visit from the past 1095 days.  Heart Catheterization: None  CT Cardiac Scoring: 07/2021 Total CAC 0 AU. Noncardiac findings:  7 mm right lower lobe pulmonary nodule, unchanged since 02/16/2018 and considered benign. Aortic Atherosclerosis (ICD10-I70.0).  LABORATORY DATA: External Labs: Collected: Jun 25, 2021 provided by PCP Total cholesterol 195, HDL 53, triglycerides 137, LDL 117, non-HDL 142 BUN 10, creatinine 0.99 Sodium 141, potassium 4, chloride 103, bicarb 29 AST 24, ALT 22, alkaline phosphatase 48 Hemoglobin 15 g/dL, hematocrit 16%  Collected: 02/02/2021 NT proBNP 27. Total cholesterol 211, HDL 63, triglycerides 98, LDL 128, non-HDL 148   IMPRESSION:    ICD-10-CM   1. Mild mitral regurgitation  I34.0 EKG 12-Lead    PCV ECHOCARDIOGRAM COMPLETE    2. Abnormal EKG  R94.31 PCV ECHOCARDIOGRAM COMPLETE    3. Premature ventricular contraction  I49.3 LONG TERM MONITOR (3-14 DAYS)    Basic metabolic panel    Magnesium    4. Atherosclerosis of aorta (HCC)  I70.0     5. Benign hypertension  I10     6. Mixed hyperlipidemia  E78.2     7. Prediabetes  R73.03        RECOMMENDATIONS: [name] is a 72 y.o. African-American male whose past medical history and cardiac risk factors include: Aortic  atherosclerosis, hypertension, mitral regurgitation, prediabetes, hyperlipidemia.   Mild mitral regurgitation Asymptomatic. Plan was repeat echocardiogram in August 2026. However due to change in clinical condition will repeat echocardiogram to reevaluate LVEF.  Abnormal EKG Premature ventricular contraction EKG notes sinus rhythm with ventricular bigeminy. Patient complaining of episodes of dizziness predominantly in the morning. Zio patch to evaluate for PVC burden and/or dysrhythmias. Check BMP and magnesium level to rule out electrolyte abnormalities  Benign hypertension Office blood pressures are within acceptable limits. Reemphasized importance of a low-salt diet.  Mixed hyperlipidemia Currently on Crestor. Recently had labs with PCP-will request records   FINAL MEDICATION LIST END OF ENCOUNTER: No orders of the defined types were placed in this encounter.   There are no discontinued medications.    Current Outpatient Medications:    Acetaminophen (TYLENOL ARTHRITIS PAIN PO), Take by mouth., Disp: , Rfl:    Cyanocobalamin (VITAMIN B-12 PO), Take by mouth., Disp: , Rfl:    FELODIPINE ER PO, Take by mouth daily., Disp: , Rfl:    Fexofenadine HCl (ALLEGRA PO), Take by mouth., Disp: ,  Rfl:    finasteride (PROSCAR) 5 MG tablet, Take 5 mg by mouth daily., Disp: , Rfl:    GLUCOSAMINE PO, Take by mouth., Disp: , Rfl:    magnesium oxide (MAG-OX) 400 (240 Mg) MG tablet, Take 1 tablet by mouth 2 (two) times daily as needed., Disp: , Rfl:    omeprazole (PRILOSEC) 20 MG capsule, Take 20 mg by mouth daily., Disp: , Rfl:    QUEtiapine (SEROQUEL) 25 MG tablet, Take 25 mg by mouth at bedtime., Disp: , Rfl:    rosuvastatin (CRESTOR) 5 MG tablet, Take 5 mg by mouth daily., Disp: , Rfl:    terazosin (HYTRIN) 5 MG capsule, Take 5 mg by mouth at bedtime., Disp: , Rfl:    TRIAMTERENE-HCTZ PO, Take by mouth daily., Disp: , Rfl:   Orders Placed This Encounter  Procedures   Basic metabolic  panel   Magnesium   LONG TERM MONITOR (3-14 DAYS)   EKG 12-Lead   PCV ECHOCARDIOGRAM COMPLETE     There are no Patient Instructions on file for this visit.   --Continue cardiac medications as reconciled in final medication list. --Return in about 6 weeks (around 09/23/2022). or sooner if needed. --Continue follow-up with your primary care physician regarding the management of your other chronic comorbid conditions.  Patient's questions and concerns were addressed to his satisfaction. He voices understanding of the instructions provided during this encounter.   This note was created using a voice recognition software as a result there may be grammatical errors inadvertently enclosed that do not reflect the nature of this encounter. Every attempt is made to correct such errors.  Tessa Lerner, [place], [facility]  Pager:  [phone] Office: [phone]

## 2022-08-13 LAB — BASIC METABOLIC PANEL
BUN/Creatinine Ratio: 8 — ABNORMAL LOW (ref 10–24)
BUN: 10 mg/dL (ref 8–27)
CO2: 21 mmol/L (ref 20–29)
Calcium: 9.7 mg/dL (ref 8.6–10.2)
Chloride: 101 mmol/L (ref 96–106)
Creatinine, Ser: 1.18 mg/dL (ref 0.76–1.27)
Glucose: 90 mg/dL (ref 70–99)
Potassium: 4.1 mmol/L (ref 3.5–5.2)
Sodium: 138 mmol/L (ref 134–144)
eGFR: 65 mL/min/{1.73_m2} (ref >59)

## 2022-08-13 LAB — MAGNESIUM: Magnesium: 1.9 mg/dL (ref 1.6–2.3)

## 2022-09-06 ENCOUNTER — Other Ambulatory Visit: Payer: Medicare HMO

## 2022-09-06 DIAGNOSIS — I493 Ventricular premature depolarization: Secondary | ICD-10-CM

## 2022-09-30 ENCOUNTER — Ambulatory Visit: Payer: Medicare HMO | Admitting: Cardiology

## 2022-10-09 ENCOUNTER — Other Ambulatory Visit: Payer: Medicare HMO

## 2022-10-21 ENCOUNTER — Ambulatory Visit: Payer: Medicare HMO | Admitting: Cardiology

## 2022-10-23 ENCOUNTER — Ambulatory Visit: Payer: Medicare HMO

## 2022-10-23 DIAGNOSIS — I340 Nonrheumatic mitral (valve) insufficiency: Secondary | ICD-10-CM

## 2022-10-23 DIAGNOSIS — R9431 Abnormal electrocardiogram [ECG] [EKG]: Secondary | ICD-10-CM

## 2022-11-08 ENCOUNTER — Ambulatory Visit: Payer: Medicare HMO | Admitting: Cardiology

## 2022-11-08 ENCOUNTER — Encounter: Payer: Self-pay | Admitting: Cardiology

## 2022-11-08 VITALS — BP 126/80 | HR 72 | Resp 16 | Ht 69.0 in | Wt 191.0 lb

## 2022-11-08 DIAGNOSIS — I10 Essential (primary) hypertension: Secondary | ICD-10-CM

## 2022-11-08 DIAGNOSIS — I493 Ventricular premature depolarization: Secondary | ICD-10-CM

## 2022-11-08 DIAGNOSIS — E782 Mixed hyperlipidemia: Secondary | ICD-10-CM

## 2022-11-08 DIAGNOSIS — R9431 Abnormal electrocardiogram [ECG] [EKG]: Secondary | ICD-10-CM

## 2022-11-08 DIAGNOSIS — I4729 Other ventricular tachycardia: Secondary | ICD-10-CM

## 2022-11-08 DIAGNOSIS — I700 Atherosclerosis of aorta: Secondary | ICD-10-CM

## 2022-11-08 DIAGNOSIS — I340 Nonrheumatic mitral (valve) insufficiency: Secondary | ICD-10-CM

## 2022-11-08 MED ORDER — DILTIAZEM HCL ER COATED BEADS 180 MG PO CP24: 180.0000 mg | ORAL_CAPSULE | Freq: Every day | ORAL | 3 refills | Status: DC

## 2022-11-08 NOTE — Progress Notes (Signed)
ID:  Gabriel Benson, DOB 1950/07/10, MRN [id number]  PCP:  Gwenyth Bender, MD  Cardiologist:  Tessa Lerner, DO, [facility] (established care Jul 09, 2021)  Date: 11/08/22 Last Office Visit: 08/12/2022  Chief Complaint  Patient presents with   Follow-up    PVCs, NSVT, MR, Review results     HPI  Gabriel Benson is a 72 y.o. African-American male whose past medical history and cardiovascular risk factors include: Aortic atherosclerosis, hypertension, mitral regurgitation, prediabetes, hyperlipidemia.   Patient was being followed by the practice for mild valvular heart disease.  However at the last office visit due to change in clinical symptoms and EKG noting frequent PVCs that shared decision was to repeat echo, check labs, and proceed with Zio patch to further quantify PVC burden.  Echocardiogram notes preserved LVEF, the severity of MR remains stable, and Zio patch notes a PVC burden of approximately 5.1%.  Clinically denies anginal chest pain or heart failure symptoms.  FUNCTIONAL STATUS: Walks 1-1.5 miles regularly 5 days a week.  ALLERGIES: No Known Allergies  MEDICATION LIST PRIOR TO VISIT: Current Meds  Medication Sig   Acetaminophen (TYLENOL ARTHRITIS PAIN PO) Take by mouth.   Cyanocobalamin (VITAMIN B-12 PO) Take by mouth.   diltiazem (CARDIZEM CD) 180 MG 24 hr capsule Take 1 capsule (180 mg total) by mouth daily.   FELODIPINE ER PO Take by mouth daily.   Fexofenadine HCl (ALLEGRA PO) Take by mouth.   finasteride (PROSCAR) 5 MG tablet Take 5 mg by mouth daily.   GLUCOSAMINE PO Take by mouth.   magnesium oxide (MAG-OX) 400 (240 Mg) MG tablet Take 1 tablet by mouth 2 (two) times daily as needed.   omeprazole (PRILOSEC) 20 MG capsule Take 20 mg by mouth daily.   QUEtiapine (SEROQUEL) 25 MG tablet Take 25 mg by mouth at bedtime.   rosuvastatin (CRESTOR) 5 MG tablet Take 5 mg by mouth daily.   terazosin (HYTRIN) 5 MG capsule Take 5 mg by mouth at bedtime.   TRIAMTERENE-HCTZ PO  Take by mouth daily.     PAST MEDICAL HISTORY: Past Medical History:  Diagnosis Date   Allergic conjunctivitis    DJD (degenerative joint disease)    H/O prostate cancer    Hypertension    Insulin resistance    Mitral valve regurgitation    PVC's (premature ventricular contractions)    Sinusitis    Situational stress     PAST SURGICAL HISTORY: History reviewed. No pertinent surgical history.  FAMILY HISTORY: The patient family history includes Hypertension in his father.  SOCIAL HISTORY:  The patient  reports that he has quit smoking. His smoking use included cigarettes. He has a 1 pack-year smoking history. He has never used smokeless tobacco. He reports current alcohol use. He reports that he does not use drugs.  REVIEW OF SYSTEMS: Review of Systems  Cardiovascular:  Negative for chest pain, claudication, dyspnea on exertion, leg swelling, near-syncope, orthopnea, palpitations, paroxysmal nocturnal dyspnea and syncope.  Respiratory:  Negative for shortness of breath.   Hematologic/Lymphatic: Negative for bleeding problem.  Neurological:  Negative for dizziness.    PHYSICAL EXAM:    11/08/2022   12:00 PM 08/12/2022    9:46 AM 10/12/2021   10:36 AM  Vitals with BMI  Height 5\' 9"  5\' 9"  5\' 9"   Weight 191 lbs 189 lbs 185 lbs  BMI 28.19 27.9 27.31  Systolic 126 138 469  Diastolic 80 85 85  Pulse 72 84 58  Physical Exam  Constitutional: No distress.  Age appropriate, hemodynamically stable.   Neck: No JVD present.  Cardiovascular: Normal rate, regular rhythm, S1 normal, S2 normal, intact distal pulses and normal pulses. Exam reveals no gallop, no S3 and no S4.  No murmur heard. Pulmonary/Chest: Effort normal and breath sounds normal. No stridor. He has no wheezes. He has no rales.  Abdominal: Soft. Bowel sounds are normal. He exhibits no distension. There is no abdominal tenderness.  Musculoskeletal:        General: No edema.     Cervical back: Neck supple.   Neurological: He is alert and oriented to person, place, and time. He has intact cranial nerves (2-12).  Skin: Skin is warm and moist.   CARDIAC DATABASE: EKG: August 12, 2022: Sinus rhythm, 83 bpm, frequent PVCs and a ventricular bigeminy pattern.  Without underlying injury pattern.  Echocardiogram: January 2012: LVEF 45-50%, grade 1 diastolic impairment, mild MR, mildly dilated right ventricle.  10/05/2021: Normal LV systolic function with visual EF 55-60%. Left ventricle cavity is normal in size. Normal left ventricular wall thickness. Normal global wall motion. Doppler evidence of grade I (impaired) diastolic dysfunction, elevated LAP.  Left atrial cavity is moderately dilated. 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 Mild (Grade I) mitral regurgitation. Moderate tricuspid regurgitation. Mild to moderate pulmonary hypertension. RVSP measures 41 mmHg. Mild pulmonic regurgitation.  Stress Testing: No results found for this or any previous visit from the past 1095 days.   CT Cardiac Scoring: 07/2021 Total CAC 0 AU. Noncardiac findings:  7 mm right lower lobe pulmonary nodule, unchanged since 02/16/2018 and considered benign. Aortic Atherosclerosis (ICD10-I70.0).  Cardiac monitor [facility] Patch): October 23, 2022 -October 30, 2022 Dominant rhythm sinus.  Heart rate 45-156 bpm.  Avg HR 74 bpm. No atrial fibrillation detected during the monitoring period. No  supraventricular tachycardia, high grade AV block, pauses (3 seconds or longer). Total supraventricular ectopic burden <1%. Total ventricular ectopic burden 5.1% (predominantly isolated beats). Asymptomatic episode of NSVT, fastest event 10/16/2022 at 4:50 PM, 4 beats, 2.2 seconds in duration, max heart rate 156 bpm. Patient triggered events: 0.   LABORATORY DATA: External Labs: Collected: Jun 25, 2021 provided by PCP Total cholesterol 195, HDL 53, triglycerides 137, LDL 117, non-HDL 142 BUN 10,  creatinine 0.99 Sodium 141, potassium 4, chloride 103, bicarb 29 AST 24, ALT 22, alkaline phosphatase 48 Hemoglobin 15 g/dL, hematocrit 16%  Collected: 02/02/2021 NT proBNP 27. Total cholesterol 211, HDL 63, triglycerides 98, LDL 128, non-HDL 148   IMPRESSION:    ICD-10-CM   1. Premature ventricular contraction  I49.3 diltiazem (CARDIZEM CD) 180 MG 24 hr capsule    PCV MYOCARDIAL PERFUSION WO LEXISCAN    2. NSVT (nonsustained ventricular tachycardia) (HCC)  I47.29 diltiazem (CARDIZEM CD) 180 MG 24 hr capsule    PCV MYOCARDIAL PERFUSION WO LEXISCAN    3. Mild mitral regurgitation  I34.0     4. Atherosclerosis of aorta (HCC)  I70.0 PCV MYOCARDIAL PERFUSION WO LEXISCAN    5. Benign hypertension  I10     6. Mixed hyperlipidemia  E78.2     7. Abnormal electrocardiogram  R94.31         RECOMMENDATIONS: Gabriel Benson is a 72 y.o. African-American male whose past medical history and cardiac risk factors include: Aortic atherosclerosis, hypertension, mitral regurgitation, prediabetes, hyperlipidemia.   Premature ventricular contraction PVC burden approximately 5.1%. Start diltiazem 180 mg p.o. daily.  Medication profile discussed with the patient. Monitor  for now  NSVT (nonsustained ventricular tachycardia) (HCC) Asymptomatic. Noted on recent Zio patch. Coronary calcium score in the past was 0. Likely would recommend either GXT or coronary CTA however given the degree of ectopy ECG response may be nondiagnostic.  Therefore we will proceed forward with exercise nuclear stress test.  Mild mitral regurgitation Asymptomatic. Overall severity remains stable. Recommend a repeat echo in 3 to 5 years sooner if needed.  Benign hypertension Office blood pressures are very well-controlled. Continue current medical therapy.  FINAL MEDICATION LIST END OF ENCOUNTER: Meds ordered this encounter  Medications   diltiazem (CARDIZEM CD) 180 MG 24 hr capsule    Sig: Take 1 capsule (180  mg total) by mouth daily.    Dispense:  90 capsule    Refill:  3    There are no discontinued medications.    Current Outpatient Medications:    Acetaminophen (TYLENOL ARTHRITIS PAIN PO), Take by mouth., Disp: , Rfl:    Cyanocobalamin (VITAMIN B-12 PO), Take by mouth., Disp: , Rfl:    diltiazem (CARDIZEM CD) 180 MG 24 hr capsule, Take 1 capsule (180 mg total) by mouth daily., Disp: 90 capsule, Rfl: 3   FELODIPINE ER PO, Take by mouth daily., Disp: , Rfl:    Fexofenadine HCl (ALLEGRA PO), Take by mouth., Disp: , Rfl:    finasteride (PROSCAR) 5 MG tablet, Take 5 mg by mouth daily., Disp: , Rfl:    GLUCOSAMINE PO, Take by mouth., Disp: , Rfl:    magnesium oxide (MAG-OX) 400 (240 Mg) MG tablet, Take 1 tablet by mouth 2 (two) times daily as needed., Disp: , Rfl:    omeprazole (PRILOSEC) 20 MG capsule, Take 20 mg by mouth daily., Disp: , Rfl:    QUEtiapine (SEROQUEL) 25 MG tablet, Take 25 mg by mouth at bedtime., Disp: , Rfl:    rosuvastatin (CRESTOR) 5 MG tablet, Take 5 mg by mouth daily., Disp: , Rfl:    terazosin (HYTRIN) 5 MG capsule, Take 5 mg by mouth at bedtime., Disp: , Rfl:    TRIAMTERENE-HCTZ PO, Take by mouth daily., Disp: , Rfl:   Orders Placed This Encounter  Procedures   PCV MYOCARDIAL PERFUSION WO LEXISCAN    There are no Patient Instructions on file for this visit.   --Continue cardiac medications as reconciled in final medication list. --Return in about 6 months (around 05/08/2023) for Follow up PCVs, NSVT, s/p stress test. . or sooner if needed. --Continue follow-up with your primary care physician regarding the management of your other chronic comorbid conditions.  Patient's questions and concerns were addressed to his satisfaction. He voices understanding of the instructions provided during this encounter.   This note was created using a voice recognition software as a result there may be grammatical errors inadvertently enclosed that do not reflect the nature of this  encounter. Every attempt is made to correct such errors.  Tessa Lerner, [place], [facility]  Pager:  [phone] Office: [phone]

## 2022-12-02 ENCOUNTER — Other Ambulatory Visit: Payer: Self-pay | Admitting: Cardiology

## 2022-12-02 DIAGNOSIS — I4729 Other ventricular tachycardia: Secondary | ICD-10-CM

## 2022-12-02 DIAGNOSIS — I493 Ventricular premature depolarization: Secondary | ICD-10-CM

## 2022-12-04 ENCOUNTER — Telehealth (HOSPITAL_COMMUNITY): Payer: Self-pay | Admitting: *Deleted

## 2022-12-04 NOTE — Telephone Encounter (Signed)
Patient given detailed instructions per Myocardial Perfusion Study Information Sheet for the test on 12/09/2022 at 8:00. Patient notified to arrive 15 minutes early and that it is imperative to arrive on time for appointment to keep from having the test rescheduled.  If you need to cancel or reschedule your appointment, please call the office within 24 hours of your appointment. . Patient verbalized understanding.Gabriel Benson

## 2022-12-09 ENCOUNTER — Ambulatory Visit (HOSPITAL_COMMUNITY): Payer: Medicare HMO | Attending: Cardiology

## 2022-12-09 DIAGNOSIS — I700 Atherosclerosis of aorta: Secondary | ICD-10-CM | POA: Diagnosis not present

## 2022-12-09 DIAGNOSIS — I4729 Other ventricular tachycardia: Secondary | ICD-10-CM | POA: Insufficient documentation

## 2022-12-09 DIAGNOSIS — I493 Ventricular premature depolarization: Secondary | ICD-10-CM | POA: Insufficient documentation

## 2022-12-09 LAB — MYOCARDIAL PERFUSION IMAGING
Angina Index: 0
Duke Treadmill Score: 5
Estimated workload: 5.7
Exercise duration (min): 4 min
Exercise duration (sec): 40 s
LV dias vol: 111 mL (ref 62–150)
LV sys vol: 74 mL
MPHR: 146 {beats}/min
Nuc Stress EF: 34 %
Peak HR: 127 {beats}/min
Percent HR: 86 %
Rest HR: 60 {beats}/min
Rest Nuclear Isotope Dose: 10.9 mCi
SDS: 0
SRS: 2
SSS: 2
ST Depression (mm): 0 mm
Stress Nuclear Isotope Dose: 32.1 mCi
TID: 0.92

## 2022-12-09 MED ORDER — TECHNETIUM TC 99M TETROFOSMIN IV KIT
32.1000 | PACK | Freq: Once | INTRAVENOUS | Status: AC | PRN
  Administered 2022-12-09: 32.1 via INTRAVENOUS

## 2022-12-09 MED ORDER — TECHNETIUM TC 99M TETROFOSMIN IV KIT
10.9000 | PACK | Freq: Once | INTRAVENOUS | Status: AC | PRN
  Administered 2022-12-09: 10.9 via INTRAVENOUS

## 2022-12-09 MED ORDER — REGADENOSON 0.4 MG/5ML IV SOLN
0.4000 mg | Freq: Once | INTRAVENOUS | Status: AC
  Administered 2022-12-09: 0.4 mg via INTRAVENOUS

## 2022-12-30 NOTE — Progress Notes (Signed)
Spoke to the patient over the phone. No active chest pain. Has noted shortness of breath with over exertional activities. Also has frequent PVCs and NSVT on a cardiac workup thus far. Given his risk factors and intermediate risk stress test concerning for perfusion defect in the lateral wall discussed conservative management as well as left heart catheterization with possible intervention.  Patient would like to proceed with left heart catheterization with possible intervention.  He will need to come in for an office visit with either myself or PA to update H&P and do preprocedure blood work.  Shared Decision Making/Informed Consent The risks [stroke (1 in 1000), death (1 in 1000), kidney failure [usually temporary] (1 in 500), bleeding (1 in 200), allergic reaction [possibly serious] (1 in 200)], benefits (diagnostic support and management of coronary artery disease) and alternatives of a cardiac catheterization were discussed in detail with Gabriel Benson and he is willing to proceed.  Please schedule preprocedural follow-up with PA and follow-up visit after the heart catheterization with myself.  Gabriel Benson [place], DO, [facility]

## 2023-01-12 NOTE — Progress Notes (Addendum)
Cardiology Office Note:    Date:  01/13/2023  ID:  Gabriel Benson, DOB [birth year], MRN [id number] PCP: Gwenyth Bender, MD  [facility] Providers Cardiologist:  None       Patient Profile:      Aortic atherosclerosis Hypertension Mitral regurgitation Echocardiogram: EF 55-60%, trace AI, mild MR, mild TR, mild-moderate PI (10/23/2022) Prediabetes Hyperlipidemia Premature ventricular contractions (PVCs) Cardiac monitor: PVC burden 5.1%, NSVT 4 beats, NSR, average heart rate 74, no SVT, no high grade AV block (11/15/2022) Nuclear stress test: Inferolateral infarct with peri-infarct ischemia, EF 34, intermediate risk (12/09/2022)         History of Present Illness:  Discussed the use of AI scribe software for clinical note transcription with the patient, who gave verbal consent to proceed.  Gabriel Benson is a 72 y.o. male who returns to arrange cardiac catheterization.  He was recently seen by Dr. Odis Hollingshead with symptoms of dizziness.  He was noted to have frequent PVCs.  Monitor was obtained and demonstrated 5.1% burden.  Echocardiogram was obtained in August 2024 and demonstrated normal EF.  He had brief nonsustained VT on his monitor.  He underwent nuclear stress testing which demonstrated inferolateral infarct with peri-infarct ischemia.  The study was felt to be intermediate risk.  Dr. Odis Hollingshead contact the patient who did note some symptoms of shortness of breath with exertional activities.  Cardiac catheterization has been recommended.  The patient returns to arrange procedure.  He is here alone.  He notes shortness of breath, particularly when walking up an incline, but denies any chest pain.  He denies any leg pain while walking but reports occasional leg swelling at the end of the day if overexerted, which resolves by morning.  He has not had syncope, orthopnea.      Review of Systems  Gastrointestinal:  Negative for hematochezia and melena.  Genitourinary:  Negative for  hematuria.  See HPI     Studies Reviewed:        Results   LABS Potassium: 4.1 (08/12/2022) Creatinine: 1.18 (08/12/2022)  Electrocardiogram: Normal sinus rhythm, normal axis, PVCs in a ventricular bigeminy pattern, QTc 474       Risk Assessment/Calculations:             Physical Exam:   VS:  BP (!) 123/50   Pulse 83   Ht 5\' 8"  (1.727 m)   Wt 190 lb 6.4 oz (86.4 kg)   SpO2 98%   BMI 28.95 kg/m    Wt Readings from Last 3 Encounters:  01/13/23 190 lb 6.4 oz (86.4 kg)  12/09/22 191 lb (86.6 kg)  11/08/22 191 lb (86.6 kg)    Constitutional:      Appearance: Healthy appearance. Not in distress.  Neck:     Vascular: No carotid bruit. JVD normal.  Pulmonary:     Breath sounds: Normal breath sounds. No wheezing. No rales.  Cardiovascular:     Normal rate. Irregular rhythm.     Murmurs: There is a grade 2/4 diastolic murmur at the LLSB.     Comments: No FA bruit bilaterally Edema:    Peripheral edema absent.  Abdominal:     Palpations: Abdomen is soft.        Assessment and Plan:   Assessment & Plan Shortness of breath Patient was recently evaluated for PVCs and dizziness.  Echocardiogram demonstrated normal EF.  Recent nuclear stress test demonstrated inferolateral infarct with peri-infarct ischemia.  This was intermediate risk.  He does  have some shortness of breath with exertion.  Cardiac catheterization has been recommended.  Risk and benefits of been discussed and the patient agrees to proceed. -Arrange cardiac catheterization -BMET, CBC today Premature ventricular contraction 5.1% on recent monitoring.  Normal ejection fraction by recent echocardiogram.  Continue Cardizem CD 180 mg daily. 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  He will likely need continued surveillance with an echocardiogram every 1 to 2  years. Essential hypertension Blood pressure controlled.  Continue Cardizem CD 180 mg daily, Hytrin 5 mg daily, triamterene/HCTZ 37.5/25 mg daily. Mixed hyperlipidemia Continue rosuvastatin 5 mg every other day.  If he has documented coronary artery disease, we will need to likely adjust his statin therapy to reach goal. Healthcare maintenance Patient requested that PSA be drawn today prior to his upcoming visit with primary care.    Informed Consent   Shared Decision Making/Informed Consent The risks [stroke (1 in 1000), death (1 in 1000), kidney failure [usually temporary] (1 in 500), bleeding (1 in 200), allergic reaction [possibly serious] (1 in 200)], benefits (diagnostic support and management of coronary artery disease) and alternatives of a cardiac catheterization were discussed in detail with Mr. Daigneault and he is willing to proceed.     Dispo:  Return for Post Procedure Follow Up.  Signed, Tereso Newcomer, PA-C

## 2023-01-12 NOTE — H&P (View-Only) (Signed)
 Cardiology Office Note:    Date:  01/13/2023  ID:  Gabriel Benson, DOB [birth year], MRN [id number] PCP: Gwenyth Bender, MD  [facility] Providers Cardiologist:  None       Patient Profile:      Aortic atherosclerosis Hypertension Mitral regurgitation Echocardiogram: EF 55-60%, trace AI, mild MR, mild TR, mild-moderate PI (10/23/2022) Prediabetes Hyperlipidemia Premature ventricular contractions (PVCs) Cardiac monitor: PVC burden 5.1%, NSVT 4 beats, NSR, average heart rate 74, no SVT, no high grade AV block (11/15/2022) Nuclear stress test: Inferolateral infarct with peri-infarct ischemia, EF 34, intermediate risk (12/09/2022)         History of Present Illness:  Discussed the use of AI scribe software for clinical note transcription with the patient, who gave verbal consent to proceed.  Gabriel Benson is a 72 y.o. male who returns to arrange cardiac catheterization.  He was recently seen by Dr. Odis Hollingshead with symptoms of dizziness.  He was noted to have frequent PVCs.  Monitor was obtained and demonstrated 5.1% burden.  Echocardiogram was obtained in August 2024 and demonstrated normal EF.  He had brief nonsustained VT on his monitor.  He underwent nuclear stress testing which demonstrated inferolateral infarct with peri-infarct ischemia.  The study was felt to be intermediate risk.  Dr. Odis Hollingshead contact the patient who did note some symptoms of shortness of breath with exertional activities.  Cardiac catheterization has been recommended.  The patient returns to arrange procedure.  He is here alone.  He notes shortness of breath, particularly when walking up an incline, but denies any chest pain.  He denies any leg pain while walking but reports occasional leg swelling at the end of the day if overexerted, which resolves by morning.  He has not had syncope, orthopnea.      Review of Systems  Gastrointestinal:  Negative for hematochezia and melena.  Genitourinary:  Negative for  hematuria.  See HPI     Studies Reviewed:        Results   LABS Potassium: 4.1 (08/12/2022) Creatinine: 1.18 (08/12/2022)  Electrocardiogram: Normal sinus rhythm, normal axis, PVCs in a ventricular bigeminy pattern, QTc 474       Risk Assessment/Calculations:             Physical Exam:   VS:  BP (!) 123/50   Pulse 83   Ht 5\' 8"  (1.727 m)   Wt 190 lb 6.4 oz (86.4 kg)   SpO2 98%   BMI 28.95 kg/m    Wt Readings from Last 3 Encounters:  01/13/23 190 lb 6.4 oz (86.4 kg)  12/09/22 191 lb (86.6 kg)  11/08/22 191 lb (86.6 kg)    Constitutional:      Appearance: Healthy appearance. Not in distress.  Neck:     Vascular: No carotid bruit. JVD normal.  Pulmonary:     Breath sounds: Normal breath sounds. No wheezing. No rales.  Cardiovascular:     Normal rate. Irregular rhythm.     Murmurs: There is a grade 2/4 diastolic murmur at the LLSB.     Comments: No FA bruit bilaterally Edema:    Peripheral edema absent.  Abdominal:     Palpations: Abdomen is soft.        Assessment and Plan:   Assessment & Plan Shortness of breath Patient was recently evaluated for PVCs and dizziness.  Echocardiogram demonstrated normal EF.  Recent nuclear stress test demonstrated inferolateral infarct with peri-infarct ischemia.  This was intermediate risk.  He does  have some shortness of breath with exertion.  Cardiac catheterization has been recommended.  Risk and benefits of been discussed and the patient agrees to proceed. -Arrange cardiac catheterization -BMET, CBC today Premature ventricular contraction 5.1% on recent monitoring.  Normal ejection fraction by recent echocardiogram.  Continue Cardizem CD 180 mg daily. 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  He will likely need continued surveillance with an echocardiogram every 1 to 2  years. Essential hypertension Blood pressure controlled.  Continue Cardizem CD 180 mg daily, Hytrin 5 mg daily, triamterene/HCTZ 37.5/25 mg daily. Mixed hyperlipidemia Continue rosuvastatin 5 mg every other day.  If he has documented coronary artery disease, we will need to likely adjust his statin therapy to reach goal. Healthcare maintenance Patient requested that PSA be drawn today prior to his upcoming visit with primary care.    Informed Consent   Shared Decision Making/Informed Consent The risks [stroke (1 in 1000), death (1 in 1000), kidney failure [usually temporary] (1 in 500), bleeding (1 in 200), allergic reaction [possibly serious] (1 in 200)], benefits (diagnostic support and management of coronary artery disease) and alternatives of a cardiac catheterization were discussed in detail with Gabriel Benson and he is willing to proceed.     Dispo:  Return for Post Procedure Follow Up.  Signed, Tereso Newcomer, PA-C

## 2023-01-13 ENCOUNTER — Ambulatory Visit: Payer: Medicare HMO | Attending: Physician Assistant | Admitting: Physician Assistant

## 2023-01-13 ENCOUNTER — Encounter: Payer: Self-pay | Admitting: Physician Assistant

## 2023-01-13 VITALS — BP 123/50 | HR 83 | Ht 68.0 in | Wt 190.4 lb

## 2023-01-13 DIAGNOSIS — I493 Ventricular premature depolarization: Secondary | ICD-10-CM | POA: Diagnosis not present

## 2023-01-13 DIAGNOSIS — R0602 Shortness of breath: Secondary | ICD-10-CM

## 2023-01-13 DIAGNOSIS — E782 Mixed hyperlipidemia: Secondary | ICD-10-CM

## 2023-01-13 DIAGNOSIS — Z0000 Encounter for general adult medical examination without abnormal findings: Secondary | ICD-10-CM

## 2023-01-13 DIAGNOSIS — R9439 Abnormal result of other cardiovascular function study: Secondary | ICD-10-CM

## 2023-01-13 DIAGNOSIS — I10 Essential (primary) hypertension: Secondary | ICD-10-CM | POA: Diagnosis not present

## 2023-01-13 DIAGNOSIS — I340 Nonrheumatic mitral (valve) insufficiency: Secondary | ICD-10-CM | POA: Diagnosis not present

## 2023-01-13 NOTE — Patient Instructions (Addendum)
Medication Instructions:  Your physician recommends that you continue on your current medications as directed. Please refer to the Current Medication list given to you today. *If you need a refill on your cardiac medications before your next appointment, please call your pharmacy*   Lab Work: TODAY-BMET & CBC If you have labs (blood work) drawn today and your tests are completely normal, you will receive your results only by: MyChart Message (if you have MyChart) OR A paper copy in the mail If you have any lab test that is abnormal or we need to change your treatment, we will call you to review the results.   Testing/Procedures: Your physician has requested that you have a cardiac catheterization. Cardiac catheterization is used to diagnose and/or treat various heart conditions. Doctors may recommend this procedure for a number of different reasons. The most common reason is to evaluate chest pain. Chest pain can be a symptom of coronary artery disease (CAD), and cardiac catheterization can show whether plaque is narrowing or blocking your heart's arteries. This procedure is also used to evaluate the valves, as well as measure the blood flow and oxygen levels in different parts of your heart. For further information please visit [email or web address] Please follow instruction sheet, as given.   Follow-Up: At [facility], you and your health needs are our priority.  As part of our continuing mission to provide you with exceptional heart care, we have created designated Provider Care Teams.  These Care Teams include your primary Cardiologist (physician) and Advanced Practice Providers (APPs -  Physician Assistants and Nurse Practitioners) who all work together to provide you with the care you need, when you need it.  We recommend signing up for the patient portal called "MyChart".  Sign up information is provided on this After Visit Summary.  MyChart is used to connect with patients for  Virtual Visits (Telemedicine).  Patients are able to view lab/test results, encounter notes, upcoming appointments, etc.  Non-urgent messages can be sent to your provider as well.   To learn more about what you can do with MyChart, go to [email or web address].    Your next appointment:   1-2 week(s)  Provider:   Tessa Lerner, MD or Tereso Newcomer, PA-C   Other Instructions          Cardiac Catheterization   You are scheduled for a Cardiac Catheterization on Wednesday, December 4 with Dr. Verne Carrow.  1. Please arrive at the [facility] (Main Entrance A) at [facility]: [address] [place], [place] [address] at 6:30 AM (This time is 2 hour(s) before your procedure to ensure your preparation).   Free valet parking service is available. You will check in at ADMITTING. The support person will be asked to wait in the waiting room.  It is OK to have someone drop you off and come back when you are ready to be discharged.        Special note: Every effort is made to have your procedure done on time. Please understand that emergencies sometimes delay scheduled procedures.  2. Diet: Do not eat solid foods after midnight.  You may have clear liquids until 5 AM the day of the procedure.  3. Labs: You will need to have blood drawn on Monday, November 18 at [facility] at [facility]. [address], [place]  Open: 7:30am - 4:30pm    Phone: [phone]. You do not need to be fasting.  4. Medication instructions in preparation  for your procedure:   Contrast Allergy: No  Stop taking, Triamterene-hydrochlorothiazide Tuesday, December 3,  On the morning of your procedure, take Aspirin 81 mg and any morning medicines NOT listed above.  You may use sips of water.  5. Plan to go home the same day, you will only stay overnight if medically necessary. 6. You MUST have a responsible adult to drive you home. 7. An adult MUST be with you the first 24  hours after you arrive home. 8. Bring a current list of your medications, and the last time and date medication taken. 9. Bring ID and current insurance cards. 10.Please wear clothes that are easy to get on and off and wear slip-on shoes.  Thank you for allowing [place] to care for you!   -- [place] [facility]

## 2023-01-14 LAB — CBC
Hematocrit: 43.7 % (ref 37.5–51.0)
Hemoglobin: 13.9 g/dL (ref 13.0–17.7)
MCH: 28.9 pg (ref 26.6–33.0)
MCHC: 31.8 g/dL (ref 31.5–35.7)
MCV: 91 fL (ref 79–97)
Platelets: 258 10*3/uL (ref 150–450)
RBC: 4.81 x10E6/uL (ref 4.14–5.80)
RDW: 12.6 % (ref 11.6–15.4)
WBC: 3.6 10*3/uL (ref 3.4–10.8)

## 2023-01-14 LAB — BASIC METABOLIC PANEL
BUN/Creatinine Ratio: 6 — ABNORMAL LOW (ref 10–24)
BUN: 7 mg/dL — ABNORMAL LOW (ref 8–27)
CO2: 27 mmol/L (ref 20–29)
Calcium: 9.7 mg/dL (ref 8.6–10.2)
Chloride: 102 mmol/L (ref 96–106)
Creatinine, Ser: 1.09 mg/dL (ref 0.76–1.27)
Glucose: 96 mg/dL (ref 70–99)
Potassium: 4.2 mmol/L (ref 3.5–5.2)
Sodium: 139 mmol/L (ref 134–144)
eGFR: 71 mL/min/{1.73_m2} (ref >59)

## 2023-01-14 LAB — PSA: Prostate Specific Ag, Serum: 3.6 ng/mL (ref 0.0–4.0)

## 2023-01-28 ENCOUNTER — Telehealth: Payer: Self-pay | Admitting: *Deleted

## 2023-01-28 NOTE — Telephone Encounter (Signed)
Cardiac Catheterization scheduled at [facility] for: Wednesday January 29, 2023 8:30 AM Arrival time [facility] Main Entrance A at: 6:30 AM  Nothing to eat after midnight prior to procedure, clear liquids until 5 AM day of procedure.  Medication instructions: -Hold:  Triamterene/HCT-AM of procedure  -Other usual morning medications can be taken with sips of water including aspirin 81 mg.  Plan to go home the same day, you will only stay overnight if medically necessary.  You must have responsible adult to drive you home.  Someone must be with you the first 24 hours after you arrive home.  Reviewed procedure instructions with patient.

## 2023-01-29 ENCOUNTER — Other Ambulatory Visit: Payer: Self-pay

## 2023-01-29 ENCOUNTER — Encounter (HOSPITAL_COMMUNITY): Payer: Self-pay | Admitting: Cardiovascular Disease

## 2023-01-29 ENCOUNTER — Encounter (HOSPITAL_COMMUNITY)
Admission: RE | Disposition: A | Discharge status: Home / Self Care | Payer: Self-pay | Source: Home / Self Care | Attending: Cardiovascular Disease

## 2023-01-29 ENCOUNTER — Ambulatory Visit (HOSPITAL_COMMUNITY)
Admission: RE | Admit: 2023-01-29 | Discharge: 2023-01-29 | Disposition: A | Discharge status: Home / Self Care | Payer: Medicare HMO | Attending: Cardiovascular Disease | Admitting: Cardiovascular Disease

## 2023-01-29 DIAGNOSIS — R9439 Abnormal result of other cardiovascular function study: Secondary | ICD-10-CM | POA: Diagnosis not present

## 2023-01-29 DIAGNOSIS — I493 Ventricular premature depolarization: Secondary | ICD-10-CM | POA: Insufficient documentation

## 2023-01-29 DIAGNOSIS — Z79899 Other long term (current) drug therapy: Secondary | ICD-10-CM | POA: Insufficient documentation

## 2023-01-29 DIAGNOSIS — R0602 Shortness of breath: Secondary | ICD-10-CM | POA: Insufficient documentation

## 2023-01-29 DIAGNOSIS — I081 Rheumatic disorders of both mitral and tricuspid valves: Secondary | ICD-10-CM | POA: Diagnosis not present

## 2023-01-29 DIAGNOSIS — E782 Mixed hyperlipidemia: Secondary | ICD-10-CM | POA: Diagnosis not present

## 2023-01-29 DIAGNOSIS — I10 Essential (primary) hypertension: Secondary | ICD-10-CM | POA: Insufficient documentation

## 2023-01-29 HISTORY — PX: LEFT HEART CATH AND CORONARY ANGIOGRAPHY: CATH118249

## 2023-01-29 SURGERY — LEFT HEART CATH AND CORONARY ANGIOGRAPHY
Anesthesia: LOCAL

## 2023-01-29 MED ORDER — FENTANYL CITRATE (PF) 100 MCG/2ML IJ SOLN
INTRAMUSCULAR | Status: AC
  Filled 2023-01-29: qty 2

## 2023-01-29 MED ORDER — HEPARIN SODIUM (PORCINE) 1000 UNIT/ML IJ SOLN
INTRAMUSCULAR | Status: DC | PRN
  Administered 2023-01-29: 4000 [IU] via INTRAVENOUS

## 2023-01-29 MED ORDER — ASPIRIN 81 MG PO CHEW: 81.0000 mg | CHEWABLE_TABLET | ORAL | Status: DC

## 2023-01-29 MED ORDER — SODIUM CHLORIDE 0.9 % IV SOLN: INTRAVENOUS | Status: DC

## 2023-01-29 MED ORDER — VERAPAMIL HCL 2.5 MG/ML IV SOLN
INTRAVENOUS | Status: DC | PRN
  Administered 2023-01-29: 10 mL via INTRA_ARTERIAL

## 2023-01-29 MED ORDER — IOHEXOL 350 MG/ML SOLN
INTRAVENOUS | Status: DC | PRN
  Administered 2023-01-29: 57 mL

## 2023-01-29 MED ORDER — HEPARIN (PORCINE) IN NACL 1000-0.9 UT/500ML-% IV SOLN
INTRAVENOUS | Status: DC | PRN
  Administered 2023-01-29 (×2): 500 mL

## 2023-01-29 MED ORDER — FENTANYL CITRATE (PF) 100 MCG/2ML IJ SOLN
INTRAMUSCULAR | Status: DC | PRN
  Administered 2023-01-29: 25 ug via INTRAVENOUS

## 2023-01-29 MED ORDER — MIDAZOLAM HCL 2 MG/2ML IJ SOLN
INTRAMUSCULAR | Status: AC
Start: 2023-01-29 — End: ?
  Filled 2023-01-29: qty 2

## 2023-01-29 MED ORDER — LIDOCAINE HCL (PF) 1 % IJ SOLN
INTRAMUSCULAR | Status: DC | PRN
  Administered 2023-01-29: 2 mL

## 2023-01-29 MED ORDER — VERAPAMIL HCL 2.5 MG/ML IV SOLN
INTRAVENOUS | Status: AC
  Filled 2023-01-29: qty 2

## 2023-01-29 MED ORDER — SODIUM CHLORIDE 0.9% FLUSH: 10.0000 mL | Freq: Two times a day (BID) | INTRAVENOUS | Status: DC

## 2023-01-29 MED ORDER — LIDOCAINE HCL (PF) 1 % IJ SOLN
INTRAMUSCULAR | Status: AC
  Filled 2023-01-29: qty 30

## 2023-01-29 MED ORDER — MIDAZOLAM HCL 2 MG/2ML IJ SOLN
INTRAMUSCULAR | Status: DC | PRN
  Administered 2023-01-29: 2 mg via INTRAVENOUS

## 2023-01-29 MED ORDER — HEPARIN SODIUM (PORCINE) 1000 UNIT/ML IJ SOLN
INTRAMUSCULAR | Status: AC
  Filled 2023-01-29: qty 10

## 2023-01-29 SURGICAL SUPPLY — 9 items
CATH 5FR JL3.5 JR4 ANG PIG MP (CATHETERS) IMPLANT
CATH LAUNCHER 5F EBU3.5 (CATHETERS) IMPLANT
DEVICE RAD COMP TR BAND LRG (VASCULAR PRODUCTS) IMPLANT
GLIDESHEATH SLEND SS 6F .021 (SHEATH) IMPLANT
GUIDEWIRE INQWIRE 1.5J.035X260 (WIRE) IMPLANT
INQWIRE 1.5J .035X260CM (WIRE) ×1
KIT SYRINGE INJ CVI SPIKEX1 (MISCELLANEOUS) IMPLANT
PACK CARDIAC CATHETERIZATION (CUSTOM PROCEDURE TRAY) ×1 IMPLANT
SET ATX-X65L (MISCELLANEOUS) IMPLANT

## 2023-01-29 NOTE — Interval H&P Note (Signed)
History and Physical Interval Note:  01/29/2023 7:37 AM  Gabriel Benson  has presented today for surgery, with the diagnosis of abnormal stress test - shortness of breath.  The various methods of treatment have been discussed with the patient and family. After consideration of risks, benefits and other options for treatment, the patient has consented to  Procedure(s): LEFT HEART CATH AND CORONARY ANGIOGRAPHY (N/A) as a surgical intervention.  The patient's history has been reviewed, patient examined, no change in status, stable for surgery.  I have reviewed the patient's chart and labs.  Questions were answered to the patient's satisfaction.    Cath Lab Visit (complete for each Cath Lab visit)  Clinical Evaluation Leading to the Procedure:   ACS: No.  Non-ACS:    Anginal Classification: CCS II  Anti-ischemic medical therapy: Maximal Therapy (2 or more classes of medications)  Non-Invasive Test Results: High-risk stress test findings: cardiac mortality >3%/year  Prior CABG: No previous CABG        Verne Carrow

## 2023-01-29 NOTE — Progress Notes (Signed)
Small amt oozing noted and pressure held x 5 min with no oozing noted and no hematoma

## 2023-02-05 ENCOUNTER — Telehealth: Payer: Self-pay | Admitting: Cardiology

## 2023-02-05 NOTE — Telephone Encounter (Signed)
Spoke with patient and he states his cath site developed bumps, its red and swollen and clear drainage  is coming out. Site is warm to touch. He think its infected

## 2023-02-05 NOTE — Telephone Encounter (Signed)
Spoke with patient and he will come to appointment tomorrow to have site looked.  Spoke with DOD. Dr. Elberta Fortis and he agrees.

## 2023-02-05 NOTE — Telephone Encounter (Signed)
Patient is calling to report he believes his arm is infected where they entered for his cath. He states there is bumps with puss in them that burst last night. Please advise.

## 2023-02-06 ENCOUNTER — Other Ambulatory Visit: Payer: Self-pay | Admitting: Cardiology

## 2023-02-06 ENCOUNTER — Ambulatory Visit: Payer: Medicare HMO | Attending: Cardiology | Admitting: Cardiology

## 2023-02-06 ENCOUNTER — Encounter: Payer: Self-pay | Admitting: Cardiology

## 2023-02-06 VITALS — BP 116/76 | HR 66 | Resp 16 | Ht 68.0 in | Wt 189.0 lb

## 2023-02-06 DIAGNOSIS — L03119 Cellulitis of unspecified part of limb: Secondary | ICD-10-CM

## 2023-02-06 DIAGNOSIS — I10 Essential (primary) hypertension: Secondary | ICD-10-CM

## 2023-02-06 DIAGNOSIS — I340 Nonrheumatic mitral (valve) insufficiency: Secondary | ICD-10-CM

## 2023-02-06 DIAGNOSIS — I700 Atherosclerosis of aorta: Secondary | ICD-10-CM

## 2023-02-06 DIAGNOSIS — I493 Ventricular premature depolarization: Secondary | ICD-10-CM

## 2023-02-06 DIAGNOSIS — E782 Mixed hyperlipidemia: Secondary | ICD-10-CM

## 2023-02-06 LAB — CBC
Hematocrit: 44.4 % (ref 37.5–51.0)
Hemoglobin: 14.7 g/dL (ref 13.0–17.7)
MCH: 29.6 pg (ref 26.6–33.0)
MCHC: 33.1 g/dL (ref 31.5–35.7)
MCV: 90 fL (ref 79–97)
Platelets: 254 10*3/uL (ref 150–450)
RBC: 4.96 x10E6/uL (ref 4.14–5.80)
RDW: 12.4 % (ref 11.6–15.4)
WBC: 3.6 10*3/uL (ref 3.4–10.8)

## 2023-02-06 NOTE — Progress Notes (Signed)
Cardiology Office Note:  .   Date:  02/06/2023  ID:  Colbert Coyer, DOB 05-19-1950, MRN [id number] PCP:  Gwenyth Bender, MD  Former Cardiology Providers: NA [facility] Providers Cardiologist:  Tessa Lerner, DO , [facility] (established care 07/09/2021) Electrophysiologist:  None  Click to update primary MD,subspecialty MD or APP then REFRESH:1}    Chief Complaint  Patient presents with   Wound Check   Follow-up    History of Present Illness: .   Gabriel Benson is a 72 y.o. African-American male whose past medical history and cardiovascular risk factors includes: Aortic atherosclerosis, hypertension, mitral regurgitation, prediabetes, hyperlipidemia.   Patient was noted to have a PVC burden of approximately 5.1% on his recent Zio patch as well as episodes of NSVT.  He underwent a nuclear stress test which was reported to be intermediate risk with concerns for possible peri-infarct ischemia.  We discussed conservative management as he did not have any chest pain versus proceeding forward with left heart catheterization.  Shared decision was the latter.  Since last office visit he has undergone left heart catheterization which notes no obstructive coronary artery disease.  He presents today for follow-up.  Patient denies any anginal chest pain or heart failure symptoms.  However he has been experiencing skin reaction/questionable infection around the right radial site, right forearm, and left forearm.  These areas are covered in gauze.  He denies any fevers chills malaise.  His strength in bilateral upper extremities is preserved.  No focal neurological deficits, no ecchymosis or induration to suggest hematoma.  Patient states he just had these skin findings for the last 3 days his cath was last Wednesday.  Review of Systems: .   Review of Systems  Cardiovascular:  Negative for chest pain, claudication, irregular heartbeat, leg swelling, near-syncope, orthopnea, palpitations,  paroxysmal nocturnal dyspnea and syncope.  Respiratory:  Negative for shortness of breath.   Hematologic/Lymphatic: Negative for bleeding problem.  Skin:  Positive for poor wound healing.    Studies Reviewed:    Echocardiogram: January 2012: LVEF 45-50%, grade 1 diastolic impairment, mild MR, mildly dilated right ventricle.   10/05/2021: Normal LV systolic function with visual EF 55-60%. Left ventricle cavity is normal in size. Normal left ventricular wall thickness. Normal global wall motion. Doppler evidence of grade I (impaired) diastolic dysfunction, elevated LAP.  Left atrial cavity is moderately dilated. 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 Mild (Grade I) mitral regurgitation. Moderate tricuspid regurgitation. Mild to moderate pulmonary hypertension. RVSP measures 41 mmHg. Mild pulmonic regurgitation.  Stress Testing: [facility] October 2024: Findings consistent with infarction with peri-infarct ischemia, intermediate risk study. See report for additional details  Heart Cath 02/06/2023 No angiographic evidence of CAD LVEDP  19 mmHg Preserved LV systolic function  CT Cardiac Scoring: 07/2021 Total CAC 0 AU. Noncardiac findings:  7 mm right lower lobe pulmonary nodule, unchanged since 02/16/2018 and considered benign. Aortic Atherosclerosis (ICD10-I70.0).   Cardiac monitor [facility] Patch): October 23, 2022 -October 30, 2022 Dominant rhythm sinus.  Heart rate 45-156 bpm.  Avg HR 74 bpm. No atrial fibrillation detected during the monitoring period. No  supraventricular tachycardia, high grade AV block, pauses (3 seconds or longer). Total supraventricular ectopic burden <1%. Total ventricular ectopic burden 5.1% (predominantly isolated beats). Asymptomatic episode of NSVT, fastest event 10/16/2022 at 4:50 PM, 4 beats, 2.2 seconds in duration, max heart rate 156 bpm. Patient triggered events: 0.   RADIOLOGY: N/A  Risk Assessment/Calculations:  NA   Labs:       Latest Ref Rng & Units 01/13/2023   10:22 AM  CBC  WBC 3.4 - 10.8 x10E3/uL 3.6   Hemoglobin 13.0 - 17.7 g/dL 16.1   Hematocrit 09.6 - 51.0 % 43.7   Platelets 150 - 450 x10E3/uL 258        Latest Ref Rng & Units 01/13/2023   10:22 AM 08/12/2022   10:44 AM 08/06/2013    8:35 AM  BMP  Glucose 70 - 99 mg/dL 96  90    BUN 8 - 27 mg/dL 7  10    Creatinine 0.45 - 1.27 mg/dL 4.09  8.11  9.14   BUN/Creat Ratio 10 - 24 6  8     Sodium 134 - 144 mmol/L 139  138    Potassium 3.5 - 5.2 mmol/L 4.2  4.1    Chloride 96 - 106 mmol/L 102  101    CO2 20 - 29 mmol/L 27  21    Calcium 8.6 - 10.2 mg/dL 9.7  9.7        Latest Ref Rng & Units 01/13/2023   10:22 AM 08/12/2022   10:44 AM 08/06/2013    8:35 AM  CMP  Glucose 70 - 99 mg/dL 96  90    BUN 8 - 27 mg/dL 7  10    Creatinine 7.82 - 1.27 mg/dL 9.56  2.13  0.86   Sodium 134 - 144 mmol/L 139  138    Potassium 3.5 - 5.2 mmol/L 4.2  4.1    Chloride 96 - 106 mmol/L 102  101    CO2 20 - 29 mmol/L 27  21    Calcium 8.6 - 10.2 mg/dL 9.7  9.7      No results found for: "CHOL", "HDL", "LDLCALC", "LDLDIRECT", "TRIG", "CHOLHDL" No results for input(s): "LIPOA" in the last 8760 hours. No components found for: "NTPROBNP" No results for input(s): "PROBNP" in the last 8760 hours. No results for input(s): "TSH" in the last 8760 hours.  External Labs: Collected: Jun 25, 2021 provided by PCP Total cholesterol 195, HDL 53, triglycerides 137, LDL 117, non-HDL 142 BUN 10, creatinine 0.99 Sodium 141, potassium 4, chloride 103, bicarb 29 AST 24, ALT 22, alkaline phosphatase 48 Hemoglobin 15 g/dL, hematocrit 57%   Collected: 02/02/2021 NT proBNP 27. Total cholesterol 211, HDL 63, triglycerides 98, LDL 128, non-HDL 148  Physical Exam:    Today's Vitals   02/06/23 0814  BP: 116/76  Pulse: 66  Resp: 16  SpO2: 98%  Weight: 189 lb (85.7 kg)  Height: 5\' 8"  (1.727 m)   Body mass index is 28.74 kg/m. Wt Readings from Last 3  Encounters:  02/06/23 189 lb (85.7 kg)  01/29/23 175 lb (79.4 kg)  01/13/23 190 lb 6.4 oz (86.4 kg)    Physical Exam  Constitutional: No distress.  Age appropriate, hemodynamically stable.   Neck: No JVD present.  Cardiovascular: Normal rate, regular rhythm, S1 normal, S2 normal, intact distal pulses and normal pulses. Exam reveals no gallop, no S3 and no S4.  No murmur heard. Pulmonary/Chest: Effort normal and breath sounds normal. No stridor. He has no wheezes. He has no rales.  Abdominal: Soft. Bowel sounds are normal. He exhibits no distension. There is no abdominal tenderness.  Musculoskeletal:        General: No edema.     Cervical back: Neck supple.  Neurological: He is alert and oriented to person, place, and time. He has intact cranial  nerves (2-12).  Skin: Skin is warm and moist.  Right UE: Around the right radial site and right forearm there is blisters with erythema and warmth predominately affecting the dermis layer.  Blisters on the radial side have already open.   Left upper extremity: Left mid forearm erythematous, warm, blisters, no active drainage   Right UE:        Left Forearm:       Impression & Recommendation(s):  Impression:   ICD-10-CM   1. Cellulitis of upper extremity, unspecified laterality  L03.119 CBC    VAS [place] UPPER EXTREMITY ARTERIAL DUPLEX    CBC    2. Premature ventricular contraction  I49.3     3. Nonrheumatic mitral valve regurgitation  I34.0     4. Essential hypertension  I10     5. Mixed hyperlipidemia  E78.2     6. Atherosclerosis of aorta (HCC)  I70.0        Recommendation(s):  Cellulitis of upper extremity, unspecified laterality Noted bilaterally. Started about 3 days ago, according to the patient.  Heart catheterization was last Wednesday. Right radial site does not show ecchymosis, hematoma, no bruit appreciated, 2+ pulses, adduction and abduction of the fingers are preserved.  I suspect the findings are  concerning for cellulitis will likely require antibiotics. I have asked him to follow-up with PCP today for further evaluation if unable to be seen he should go to the ED for further evaluation and management.   He denies fevers chills malaise.  Will check CBC to evaluate WBC count. I do not suspect that he has any findings to suggest pseudoaneurysm or hematoma.  But for safety will order left upper extremity arterial duplex. I would like to see him back in close follow-up to make sure that the affected areas are resolving/resolved  Premature ventricular contraction Initially noted to have a PVC burden of 5.1% Has done well with medical therapy-continue diltiazem 180 mg p.o. daily. EF is preserved.  Nonrheumatic mitral valve regurgitation Asymptomatic. Will follow clinically with repeat echocardiogram in 3 to 5 years.  Essential hypertension Office blood pressures are very well-controlled. Continue triamterene/hydrochlorothiazide 37.5/25 mg p.o. daily  Mixed hyperlipidemia Continue Crestor 5 mg p.o. daily.  Orders Placed:  Orders Placed This Encounter  Procedures   CBC    Standing Status:   Future    Number of Occurrences:   1    Expected Date:   02/06/2023    Expiration Date:   02/06/2024    Final Medication List:   No orders of the defined types were placed in this encounter.   There are no discontinued medications.   Current Outpatient Medications:    acetaminophen (TYLENOL) 650 MG CR tablet, Take 1,300 mg by mouth daily., Disp: , Rfl:    azelastine (ASTELIN) 0.1 % nasal spray, Place 1 spray into both nostrils 2 (two) times daily. Use in each nostril as directed, Disp: , Rfl:    Cyanocobalamin (VITAMIN B-12 PO), Take 1 tablet by mouth daily., Disp: , Rfl:    diltiazem (CARDIZEM CD) 180 MG 24 hr capsule, Take 1 capsule (180 mg total) by mouth daily., Disp: 90 capsule, Rfl: 3   finasteride (PROSCAR) 5 MG tablet, Take 5 mg by mouth daily., Disp: , Rfl:    GLUCOSAMINE PO,  Take 1 tablet by mouth 2 (two) times daily., Disp: , Rfl:    magnesium oxide (MAG-OX) 400 (240 Mg) MG tablet, Take 400 mg by mouth 2 (two) times daily as needed (cramping)., Disp: ,  Rfl:    metoprolol succinate (TOPROL-XL) 25 MG 24 hr tablet, Take 25 mg by mouth daily., Disp: , Rfl:    omeprazole (PRILOSEC) 20 MG capsule, Take 20 mg by mouth daily., Disp: , Rfl:    QUEtiapine (SEROQUEL) 25 MG tablet, Take 25 mg by mouth at bedtime., Disp: , Rfl:    rosuvastatin (CRESTOR) 5 MG tablet, Take 5 mg by mouth every other day., Disp: , Rfl:    tadalafil (CIALIS) 5 MG tablet, Take 5 mg by mouth daily as needed (pt prefrenece)., Disp: , Rfl:    terazosin (HYTRIN) 5 MG capsule, Take 5 mg by mouth at bedtime., Disp: , Rfl:    triamterene-hydrochlorothiazide (MAXZIDE-25) 37.5-25 MG tablet, Take 1 tablet by mouth daily., Disp: , Rfl:   Consent:   N/A  Disposition:   1 week sooner if needed Patient may be asked to follow-up sooner based on the results of the above-mentioned testing.  His questions and concerns were addressed to his satisfaction. He voices understanding of the recommendations provided during this encounter.    Signed, Tessa Lerner, DO, [facility]  [facility]  [address] [place], [place] [address] 02/06/2023 10:21 AM

## 2023-02-06 NOTE — Patient Instructions (Signed)
Medication Instructions:  Your physician recommends that you continue on your current medications as directed. Please refer to the Current Medication list given to you today.  *If you need a refill on your cardiac medications before your next appointment, please call your pharmacy*  Lab Work: To be completed today: CBC  If you have labs (blood work) drawn today and your tests are completely normal, you will receive your results only by: MyChart Message (if you have MyChart) OR A paper copy in the mail If you have any lab test that is abnormal or we need to change your treatment, we will call you to review the results.  Testing/Procedures: Your physician has requested for you to have a bilateral arterial ultrasound of the upper extremities   Follow-Up: At [facility], you and your health needs are our priority.  As part of our continuing mission to provide you with exceptional heart care, we have created designated Provider Care Teams.  These Care Teams include your primary Cardiologist (physician) and Advanced Practice Providers (APPs -  Physician Assistants and Nurse Practitioners) who all work together to provide you with the care you need, when you need it.   Your next appointment:   1 week(s) for wound check  The format for your next appointment:   In Person  Provider:   Tessa Lerner, DO or APP

## 2023-02-14 ENCOUNTER — Ambulatory Visit: Payer: Medicare HMO | Admitting: Nurse Practitioner

## 2023-03-07 ENCOUNTER — Other Ambulatory Visit (HOSPITAL_COMMUNITY): Payer: Medicare HMO

## 2023-07-25 ENCOUNTER — Ambulatory Visit: Admitting: Cardiology

## 2023-07-30 ENCOUNTER — Ambulatory Visit: Attending: Cardiology | Admitting: Cardiology

## 2023-07-30 ENCOUNTER — Encounter: Payer: Self-pay | Admitting: Cardiology

## 2023-07-30 ENCOUNTER — Ambulatory Visit: Attending: Cardiology

## 2023-07-30 VITALS — BP 120/68 | HR 45 | Resp 16 | Ht 68.0 in | Wt 187.2 lb

## 2023-07-30 DIAGNOSIS — I10 Essential (primary) hypertension: Secondary | ICD-10-CM | POA: Diagnosis not present

## 2023-07-30 DIAGNOSIS — E782 Mixed hyperlipidemia: Secondary | ICD-10-CM | POA: Diagnosis not present

## 2023-07-30 DIAGNOSIS — I493 Ventricular premature depolarization: Secondary | ICD-10-CM | POA: Diagnosis not present

## 2023-07-30 DIAGNOSIS — I340 Nonrheumatic mitral (valve) insufficiency: Secondary | ICD-10-CM

## 2023-07-30 MED ORDER — LOSARTAN POTASSIUM 25 MG PO TABS: 25.0000 mg | ORAL_TABLET | Freq: Every morning | ORAL | 3 refills | Status: AC

## 2023-07-30 NOTE — Progress Notes (Addendum)
 ` Cardiology Office Note:  .   Date:  07/30/2023  ID:  Gabriel Benson, DOB 1950-09-06, MRN [id number] PCP:  Addie Camellia CROME, MD  Former Cardiology Providers: NA  HeartCare Providers Cardiologist:  Gabriel Large, Gabriel Benson , [facility] (established care 07/09/2021) Electrophysiologist:  None  Click to update primary MD,subspecialty MD or APP then REFRESH:1}    Chief Complaint  Patient presents with   Premature ventricular contraction   Follow-up    History of Present Illness: .   Gabriel Benson is a 73 y.o. African-American male whose past medical history and cardiovascular risk factors includes: Premature ventricular contractions, aortic atherosclerosis, hypertension, mitral regurgitation, prediabetes, hyperlipidemia.   Patient was noted to have a PVC burden of approximately 5.1% on his recent Zio patch as well as episodes of NSVT.  He underwent a nuclear stress test which was reported to be intermediate risk with concerns for possible peri-infarct ischemia.  We discussed conservative management as he did not have any chest pain versus proceeding forward with left heart catheterization.  Shared decision was the latter.  Since last office visit he has undergone left heart catheterization which notes no obstructive coronary artery disease.  Patient last seen in the office in December 2024 and is here for follow-up.  Patient is doing well overall from a cardiovascular standpoint as last office visit. However, at times patient endorses that his blood pressures are soft.  According to his smart phone his average blood pressure over the last 30 days is 99/74 mmHg.  At times he feels lightheaded and dizziness but the symptoms are short-lived.  Review of Systems: .   Review of Systems  Cardiovascular:  Negative for chest pain, claudication, irregular heartbeat, leg swelling, near-syncope, orthopnea, palpitations, paroxysmal nocturnal dyspnea and syncope.  Respiratory:  Negative for shortness of breath.    Hematologic/Lymphatic: Negative for bleeding problem.    Studies Reviewed:   EKG Interpretation Date/Time:  Wednesday July 30 2023 15:20:05 EDT Ventricular Rate:  72 PR Interval:  178 QRS Duration:  106 QT Interval:  392 QTC Calculation: 429 R Axis:   -30  Text Interpretation: Sinus rhythm with occasional Premature ventricular complexes Left axis deviation No previous ECGs available Confirmed by Benson Gabriel [phone]) on 07/30/2023 3:30:49 PM  Echocardiogram: January 2012: LVEF 45-50%, grade 1 diastolic impairment, mild MR, mildly dilated right ventricle.   10/05/2021: Normal LV systolic function with visual EF 55-60%. Left ventricle cavity is normal in size. Normal left ventricular wall thickness. Normal global wall motion. Doppler evidence of grade I (impaired) diastolic dysfunction, elevated LAP.  Left atrial cavity is moderately dilated. 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 Mild (Grade I) mitral regurgitation. Moderate tricuspid regurgitation. Mild to moderate pulmonary hypertension. RVSP measures 41 mmHg. Mild pulmonic regurgitation.  Stress Testing: MPI October 2024: Findings consistent with infarction with peri-infarct ischemia, intermediate risk study. See report for additional details  Heart Cath 02/06/2023 No angiographic evidence of CAD LVEDP  19 mmHg Preserved LV systolic function  CT Cardiac Scoring: 07/2021 Total CAC 0 AU. Noncardiac findings:  7 mm right lower lobe pulmonary nodule, unchanged since 02/16/2018 and considered benign. Aortic Atherosclerosis (ICD10-I70.0).   Cardiac monitor (Zio Patch): October 23, 2022 -October 30, 2022 Dominant rhythm sinus.  Heart rate 45-156 bpm.  Avg HR 74 bpm. No atrial fibrillation detected during the monitoring period. No  supraventricular tachycardia, high grade AV block, pauses (3 seconds or longer). Total supraventricular ectopic burden <1%. Total ventricular ectopic burden 5.1%  (  predominantly isolated beats). Asymptomatic episode of NSVT, fastest event 10/16/2022 at 4:50 PM, 4 beats, 2.2 seconds in duration, max heart rate 156 bpm. Patient triggered events: 0.   RADIOLOGY: N/A  Risk Assessment/Calculations:   NA   Labs:       Latest Ref Rng & Units 02/06/2023    9:15 AM 01/13/2023   10:22 AM  CBC  WBC 3.4 - 10.8 x10E3/uL 3.6  3.6   Hemoglobin 13.0 - 17.7 g/dL 85.2  86.0   Hematocrit 37.5 - 51.0 % 44.4  43.7   Platelets 150 - 450 x10E3/uL 254  258        Latest Ref Rng & Units 01/13/2023   10:22 AM 08/12/2022   10:44 AM 08/06/2013    8:35 AM  BMP  Glucose 70 - 99 mg/dL 96  90    BUN 8 - 27 mg/dL 7  10    Creatinine 9.23 - 1.27 mg/dL 8.90  8.81  8.90   BUN/Creat Ratio 10 - 24 6  8     Sodium 134 - 144 mmol/L 139  138    Potassium 3.5 - 5.2 mmol/L 4.2  4.1    Chloride 96 - 106 mmol/L 102  101    CO2 20 - 29 mmol/L 27  21    Calcium 8.6 - 10.2 mg/dL 9.7  9.7        Latest Ref Rng & Units 01/13/2023   10:22 AM 08/12/2022   10:44 AM 08/06/2013    8:35 AM  CMP  Glucose 70 - 99 mg/dL 96  90    BUN 8 - 27 mg/dL 7  10    Creatinine 9.23 - 1.27 mg/dL 8.90  8.81  8.90   Sodium 134 - 144 mmol/L 139  138    Potassium 3.5 - 5.2 mmol/L 4.2  4.1    Chloride 96 - 106 mmol/L 102  101    CO2 20 - 29 mmol/L 27  21    Calcium 8.6 - 10.2 mg/dL 9.7  9.7      No results found for: CHOL, HDL, LDLCALC, LDLDIRECT, TRIG, CHOLHDL No results for input(s): LIPOA in the last 8760 hours. No components found for: NTPROBNP No results for input(s): PROBNP in the last 8760 hours. No results for input(s): TSH in the last 8760 hours.  External Labs: Collected: Jun 25, 2021 provided by PCP Total cholesterol 195, HDL 53, triglycerides 137, LDL 117, non-HDL 142 BUN 10, creatinine 0.99 Sodium 141, potassium 4, chloride 103, bicarb 29 AST 24, ALT 22, alkaline phosphatase 48 Hemoglobin 15 g/dL, hematocrit 54%   Collected: 02/02/2021 NT proBNP 27. Total  cholesterol 211, HDL 63, triglycerides 98, LDL 128, non-HDL 148  Physical Exam:    Today's Vitals   07/30/23 1517  BP: 120/68  Pulse: (!) 45  Resp: 16  SpO2: 92%  Weight: 187 lb 3.2 oz (84.9 kg)  Height: 5' 8 (1.727 m)   Body mass index is 28.46 kg/m. Wt Readings from Last 3 Encounters:  07/30/23 187 lb 3.2 oz (84.9 kg)  02/06/23 189 lb (85.7 kg)  01/29/23 175 lb (79.4 kg)    Physical Exam  Constitutional: No distress.  hemodynamically stable  Neck: No JVD present.  Cardiovascular: Normal rate, regular rhythm, S1 normal and S2 normal. Exam reveals no gallop, no S3 and no S4.  No murmur heard. Pulmonary/Chest: Effort normal and breath sounds normal. No stridor. He has no wheezes. He has no rales.  Musculoskeletal:  General: No edema.     Cervical back: Neck supple.  Skin: Skin is warm.   Impression & Recommendation(s):  Impression:   ICD-10-CM   1. Premature ventricular contraction  I49.3 EKG 12-Lead    Basic metabolic panel with GFR    Magnesium    LONG TERM MONITOR (3-14 DAYS)    Basic metabolic panel with GFR    Magnesium    2. Nonrheumatic mitral valve regurgitation  I34.0     3. Essential hypertension  I10     4. Mixed hyperlipidemia  E78.2        Recommendation(s):  Premature ventricular contraction Initially noted to have a PVC burden of 5.1% I was under the impression that he was taking only diltiazem  but looking at his medication list at today's office visit he is also taking Toprol-XL 25 mg p.o. daily. He is also on triamterene and hydrochlorothiazide which predisposes him to electrolyte abnormalities and given the fact that his average blood pressure for the last 30 days is 99/74 would like to discontinue Maxide. Check BMP and magnesium today to rule out electrolyte abnormalities contributing to his PVCs. Will discontinue Toprol-XL, will avoid to AV nodal blocking agents. Patient is asked to continue Cardizem  180 mg p.o. daily  Nonrheumatic  mitral valve regurgitation Asymptomatic. Will follow clinically with repeat echocardiogram in 3 to 5 years.  Essential hypertension However, endorses symptoms of hypotension such as lightheaded and dizziness which are short-lived. Based on his blood pressure log on his smart phone average readings are 99/74. Discontinue Maxide 25 mg p.o. daily. Start losartan  25 mg p.o. every morning. Will repeat BMP and magnesium in 1 week Recommended to keep an ambulatory blood pressure log.  Mixed hyperlipidemia Continue Crestor 5 mg p.o. daily.  Orders Placed:  Orders Placed This Encounter  Procedures   Basic metabolic panel with GFR    Standing Status:   Future    Number of Occurrences:   1    Expected Date:   07/30/2023    Expiration Date:   07/29/2024   Magnesium    Standing Status:   Future    Number of Occurrences:   1    Expected Date:   07/30/2023    Expiration Date:   07/29/2024   LONG TERM MONITOR (3-14 DAYS)    Standing Status:   Future    Number of Occurrences:   1    Expected Date:   08/06/2023    Expiration Date:   07/29/2024    Where should this test be performed?:   [facility]    Does the patient have an implanted cardiac device?:   No    Prescribed days of wear:   7    Type of enrollment:   Home Enrollment    Vendor::   Zio    Reason for Exam:   PVC's I49.3, 147.10   EKG 12-Lead    Final Medication List:    Meds ordered this encounter  Medications   losartan  (COZAAR ) 25 MG tablet    Sig: Take 1 tablet (25 mg total) by mouth in the morning.    Dispense:  90 tablet    Refill:  3    Medications Discontinued During This Encounter  Medication Reason   triamterene-hydrochlorothiazide (MAXZIDE-25) 37.5-25 MG tablet Discontinued by provider   metoprolol succinate (TOPROL-XL) 25 MG 24 hr tablet Discontinued by provider     Current Outpatient Medications:    acetaminophen (TYLENOL) 650 MG CR tablet, Take 1,300 mg by mouth  daily., Disp: , Rfl:    azelastine (ASTELIN) 0.1 %  nasal spray, Place 1 spray into both nostrils 2 (two) times daily. Use in each nostril as directed, Disp: , Rfl:    Cyanocobalamin (VITAMIN B-12 PO), Take 1 tablet by mouth daily., Disp: , Rfl:    finasteride (PROSCAR) 5 MG tablet, Take 5 mg by mouth daily., Disp: , Rfl:    GLUCOSAMINE PO, Take 1 tablet by mouth 2 (two) times daily., Disp: , Rfl:    losartan  (COZAAR ) 25 MG tablet, Take 1 tablet (25 mg total) by mouth in the morning., Disp: 90 tablet, Rfl: 3   magnesium oxide (MAG-OX) 400 (240 Mg) MG tablet, Take 400 mg by mouth 2 (two) times daily as needed (cramping)., Disp: , Rfl:    omeprazole (PRILOSEC) 20 MG capsule, Take 20 mg by mouth daily., Disp: , Rfl:    QUEtiapine (SEROQUEL) 25 MG tablet, Take 25 mg by mouth at bedtime., Disp: , Rfl:    rosuvastatin (CRESTOR) 5 MG tablet, Take 5 mg by mouth every other day., Disp: , Rfl:    tadalafil (CIALIS) 5 MG tablet, Take 5 mg by mouth daily as needed (pt prefrenece)., Disp: , Rfl:    terazosin (HYTRIN) 5 MG capsule, Take 5 mg by mouth at bedtime., Disp: , Rfl:    diltiazem  (CARDIZEM  CD) 180 MG 24 hr capsule, Take 1 capsule (180 mg total) by mouth daily., Disp: 90 capsule, Rfl: 3  Consent:   N/A  Disposition:   3 months or sooner if needed  His questions and concerns were addressed to his satisfaction. He voices understanding of the recommendations provided during this encounter.    Signed, Gabriel Michele HAS, [facility]  A Division of [place] [facility] [address] [place], [place] [address]  [place], [place] [address]  07/30/2023 5:10 PM   Addendum: Patient was consented for GXT but records didn't reflect it. Updating the prior documentation.   Informed Consent   Shared Decision Making/Informed Consent The risks [chest pain, shortness of breath, cardiac arrhythmias, dizziness, blood pressure fluctuations, myocardial infarction, stroke/transient ischemic attack, and life-threatening complications (estimated to  be 1 in 10,000)], benefits (risk stratification, diagnosing coronary artery disease, treatment guidance) and alternatives of an exercise tolerance test were discussed in detail with Gabriel Benson and he agrees to proceed.     Gabriel Benson [place], Gabriel Benson, [facility] 11/05/23

## 2023-07-30 NOTE — Progress Notes (Unsigned)
 Enrolled patient for a 7 day Zio XT monitor to be mailed to patients home.

## 2023-07-30 NOTE — Patient Instructions (Addendum)
 Medication Instructions:  Stop Metoprolol and Maxzide    Start Losartan 25 mg in the mornings  *If you need a refill on your cardiac medications before your next appointment, please call your pharmacy*  Lab Work: TODAY BMP and Magnesium   1 WEEK BMP and Magnesium  If you have labs (blood work) drawn today and your tests are completely normal, you will receive your results only by: MyChart Message (if you have MyChart) OR A paper copy in the mail If you have any lab test that is abnormal or we need to change your treatment, we will call you to review the results.  Testing/Procedures: Your physician has requested that you wear a Zio heart monitor for 7 days. This will be mailed to your home with instructions on how to apply the monitor and how to return it when finished. Please allow 2 weeks after returning the heart monitor before our office calls you with the results.   Follow-Up: At [facility], you and your health needs are our priority.  As part of our continuing mission to provide you with exceptional heart care, our providers are all part of one team.  This team includes your primary Cardiologist (physician) and Advanced Practice Providers or APPs (Physician Assistants and Nurse Practitioners) who all work together to provide you with the care you need, when you need it.  Your next appointment:   3 month(s)  Provider:   Olinda Bertrand, DO     ZIO XT- Long Term Monitor Instructions  Your physician has requested you wear a ZIO patch monitor for 7 days.   This is a single patch monitor. [employer] supplies one patch monitor per enrollment. Additional  stickers are not available. Please do not apply patch if you will be having a Nuclear Stress Test,  Echocardiogram, Cardiac CT, MRI, or Chest Xray during the period you would be wearing the  monitor. The patch cannot be worn during these tests. You cannot remove and re-apply the  ZIO XT patch monitor.   Your ZIO patch  monitor will be mailed 3 day USPS to your address on file. It may take 3-5 days  to receive your monitor after you have been enrolled.   Once you have received your monitor, please review the enclosed instructions. Your monitor  has already been registered assigning a specific monitor serial # to you.     Billing and Patient Assistance Program Information  We have supplied [employer] with any of your insurance information on file for billing purposes.  [employer] offers a sliding scale Patient Assistance Program for patients that do not have  insurance, or whose insurance does not completely cover the cost of the ZIO monitor.  You must apply for the Patient Assistance Program to qualify for this discounted rate.   To apply, please call [employer] at [phone], select option 4, select option 2, ask to apply for  Patient Assistance Program. Sanna Crystal will ask your household income, and how many people  are in your household. They will quote your out-of-pocket cost based on that information.  [employer] will also be able to set up a 55-month, interest-free payment plan if needed.     Applying the monitor  Shave hair from upper left chest.  Hold abrader disc by orange tab. Rub abrader in 40 strokes over the upper left chest as  indicated in your monitor instructions.  Clean area with 4 enclosed alcohol pads. Let dry.  Apply patch as indicated in monitor instructions. Patch will  be placed under collarbone on left  side of chest with arrow pointing upward.  Rub patch adhesive wings for 2 minutes. Remove white label marked "1". Remove the white  label marked "2". Rub patch adhesive wings for 2 additional minutes.  While looking in a mirror, press and release button in center of patch. A small green light will  flash 3-4 times. This will be your only indicator that the monitor has been turned on.  Do not shower for the first 24 hours. You may shower after the first 24 hours.  Press the button if you feel a  symptom. You will hear a small click. Record Date, Time and  Symptom in the Patient Logbook.  When you are ready to remove the patch, follow instructions on the last 2 pages of Patient  Logbook. Stick patch monitor onto the last page of Patient Logbook.   Place Patient Logbook in the blue and white box. Use locking tab on box and tape box closed  securely. The blue and white box has prepaid postage on it. Please place it in the mailbox as  soon as possible. Your physician should have your test results approximately 7 days after the  monitor has been mailed back to [facility].   Call [facility] Customer Care at [phone] if you have questions regarding  your ZIO XT patch monitor. Call them immediately if you see an orange light blinking on your  monitor.   If your monitor falls off in less than 4 days, contact our Monitor department at [phone].   If your monitor becomes loose or falls off after 4 days call [employer] at [phone] for  suggestions on securing your monitor.     Other Instructions

## 2023-07-31 ENCOUNTER — Ambulatory Visit: Payer: Self-pay

## 2023-07-31 DIAGNOSIS — I10 Essential (primary) hypertension: Secondary | ICD-10-CM

## 2023-07-31 LAB — BASIC METABOLIC PANEL WITH GFR
BUN/Creatinine Ratio: 11 (ref 10–24)
BUN: 13 mg/dL (ref 8–27)
CO2: 21 mmol/L (ref 20–29)
Calcium: 9.4 mg/dL (ref 8.6–10.2)
Chloride: 103 mmol/L (ref 96–106)
Creatinine, Ser: 1.2 mg/dL (ref 0.76–1.27)
Glucose: 85 mg/dL (ref 70–99)
Potassium: 4.3 mmol/L (ref 3.5–5.2)
Sodium: 141 mmol/L (ref 134–144)
eGFR: 63 mL/min/{1.73_m2} (ref >59)

## 2023-07-31 LAB — MAGNESIUM: Magnesium: 2.1 mg/dL (ref 1.6–2.3)

## 2023-09-21 DIAGNOSIS — I493 Ventricular premature depolarization: Secondary | ICD-10-CM | POA: Diagnosis not present

## 2023-09-24 ENCOUNTER — Other Ambulatory Visit: Payer: Self-pay

## 2023-09-24 ENCOUNTER — Other Ambulatory Visit (HOSPITAL_COMMUNITY): Payer: Self-pay

## 2023-09-24 ENCOUNTER — Telehealth: Payer: Self-pay | Admitting: Cardiology

## 2023-09-24 DIAGNOSIS — R0602 Shortness of breath: Secondary | ICD-10-CM

## 2023-09-24 DIAGNOSIS — R9431 Abnormal electrocardiogram [ECG] [EKG]: Secondary | ICD-10-CM

## 2023-09-24 MED ORDER — FLECAINIDE ACETATE 50 MG PO TABS
50.0000 mg | ORAL_TABLET | Freq: Two times a day (BID) | ORAL | 3 refills | Status: DC
Start: 2023-09-24 — End: 2023-09-24

## 2023-09-24 MED ORDER — FLECAINIDE ACETATE 50 MG PO TABS
50.0000 mg | ORAL_TABLET | Freq: Two times a day (BID) | ORAL | 3 refills | Status: DC
  Filled 2023-09-24: qty 180, 90d supply, fill #0

## 2023-09-24 NOTE — Telephone Encounter (Signed)
 Pt c/o medication issue:  1. Name of Medication:   Flecainide    2. How are you currently taking this medication (dosage and times per day)?  Not started yet  3. Are you having a reaction (difficulty breathing--STAT)?   4. What is your medication issue?   Patient stated he was prescribed this medication but it is not at his pharmacy as yet.  Patient wants prescription [id number] - [place], [place] - [address]

## 2023-09-24 NOTE — Telephone Encounter (Signed)
 Spoke with pt to schedule 1 week Flecainide  check EKG. Pt asked if he was planning to start Flecainide  tomorrow, pt stated he was meaning to call us  back because [facility] and unsure when they will be back in stock. Rx sent to [facility] instead to be picked up today or tomorrow. Pt is scheduled for RN visit 8/8 at 2 PM.

## 2023-09-24 NOTE — Telephone Encounter (Signed)
 Spoke to patient he stated he read results of monitor on mychart.Stated he is suppose to take Flecainide .Advised Dr.Tolia has prescribed Flecainide  50 mg twice a day.Advised he is suppose to have a GXT 4 weeks after starting.Flecainide  prescription sent to his pharmacy.Order placed for GXT to be done in 4 weeks after starting Flecainide .Message sent to scheduling pool to schedule GXT.Message sent to Dr.Tolia's RN.

## 2023-09-29 NOTE — Progress Notes (Signed)
 GXT already ordered and scheduled. Pt contacted and aware.

## 2023-10-03 ENCOUNTER — Other Ambulatory Visit (HOSPITAL_COMMUNITY): Payer: Self-pay

## 2023-10-03 ENCOUNTER — Ambulatory Visit: Attending: Cardiology | Admitting: *Deleted

## 2023-10-03 VITALS — BP 112/62 | HR 79 | Ht 68.5 in | Wt 187.0 lb

## 2023-10-03 DIAGNOSIS — Z5181 Encounter for therapeutic drug level monitoring: Secondary | ICD-10-CM | POA: Diagnosis not present

## 2023-10-03 DIAGNOSIS — Z79899 Other long term (current) drug therapy: Secondary | ICD-10-CM

## 2023-10-03 NOTE — Progress Notes (Signed)
   Nurse Visit   Date of Encounter: 10/03/2023 ID: Gabriel Benson, DOB 02-13-1951, MRN [id number]  PCP:  Addie Camellia CROME, MD   [facility] Providers Cardiologist:  Madonna Large, DO      Visit Details   VS:  BP 112/62 (BP Location: Left Arm, Patient Position: Sitting, Cuff Size: Normal)   Pulse 79   Ht 5' 8.5 (1.74 m)   Wt 187 lb (84.8 kg)   SpO2 98%   BMI 28.02 kg/m  , BMI Body mass index is 28.02 kg/m.  Wt Readings from Last 3 Encounters:  10/03/23 187 lb (84.8 kg)  07/30/23 187 lb 3.2 oz (84.9 kg)  02/06/23 189 lb (85.7 kg)     Reason for visit: EKG 1 week post Flecainide  Therapy Performed today: Vitals, EKG, Provider consulted:Dr. Lavona (DOD 1), and Education Changes (medications, testing, etc.) : No Changes; continue current medications; follow up as planned with Dr Large on 10/31/2023; also has Stress Test on 10/22/2023 for continued Flecainide  Therapy         Med List Updated to reflect current medication changes ; also, pt brought in his BP log from home and Dr. Lavona reviewed this. BP today 112/62; HR 79.  Length of Visit: 10 minutes    Medications Adjustments/Labs and Tests Ordered: Orders Placed This Encounter  Procedures   EKG 12-Lead   No orders of the defined types were placed in this encounter.    Signed, Zara Buel Fret, RN  10/03/2023 2:48 PM

## 2023-10-03 NOTE — Patient Instructions (Signed)
 Medication Instructions:  No Changes; Continue Current Medications   Lab Work: None  Follow-Up: At [facility], you and your health needs are our priority.  As part of our continuing mission to provide you with exceptional heart care, our providers are all part of one team.  This team includes your primary Cardiologist (physician) and Advanced Practice Providers or APPs (Physician Assistants and Nurse Practitioners) who all work together to provide you with the care you need, when you need it.  Your next appointment:    10/22/2023 at 3:30 pm (Stress Test)  Then 10/31/2023 at 8:00 am with Dr. Michele

## 2023-10-14 ENCOUNTER — Telehealth (HOSPITAL_COMMUNITY): Payer: Self-pay | Admitting: *Deleted

## 2023-10-14 ENCOUNTER — Encounter (HOSPITAL_COMMUNITY): Payer: Self-pay | Admitting: *Deleted

## 2023-10-14 NOTE — Telephone Encounter (Signed)
 Instructions for upcoming stress test sent via USPS.  Argentina Bees, RN

## 2023-10-22 ENCOUNTER — Other Ambulatory Visit: Payer: Self-pay | Admitting: Cardiology

## 2023-10-22 ENCOUNTER — Ambulatory Visit (HOSPITAL_COMMUNITY)

## 2023-10-22 DIAGNOSIS — I493 Ventricular premature depolarization: Secondary | ICD-10-CM

## 2023-10-22 DIAGNOSIS — I4729 Other ventricular tachycardia: Secondary | ICD-10-CM

## 2023-10-31 ENCOUNTER — Encounter (HOSPITAL_COMMUNITY): Payer: Self-pay | Admitting: *Deleted

## 2023-10-31 ENCOUNTER — Ambulatory Visit: Admitting: Cardiology

## 2023-11-10 ENCOUNTER — Ambulatory Visit (HOSPITAL_COMMUNITY)
Admission: RE | Admit: 2023-11-10 | Discharge: 2023-11-10 | Disposition: A | Discharge status: Home / Self Care | Source: Ambulatory Visit | Attending: Cardiology | Admitting: Cardiology

## 2023-11-10 DIAGNOSIS — R0602 Shortness of breath: Secondary | ICD-10-CM | POA: Insufficient documentation

## 2023-11-10 DIAGNOSIS — R9431 Abnormal electrocardiogram [ECG] [EKG]: Secondary | ICD-10-CM | POA: Insufficient documentation

## 2023-11-10 LAB — EXERCISE TOLERANCE TEST
Angina Index: 0
Duke Treadmill Score: 5
Estimated workload: 6.6
Exercise duration (min): 4 min
Exercise duration (sec): 53 s
MPHR: 146 {beats}/min
Peak HR: 125 {beats}/min
Percent HR: 86 %
Rest HR: 74 {beats}/min
ST Depression (mm): 0 mm

## 2023-11-11 ENCOUNTER — Ambulatory Visit: Payer: Self-pay | Admitting: Cardiology

## 2023-11-11 DIAGNOSIS — I493 Ventricular premature depolarization: Secondary | ICD-10-CM

## 2023-11-17 ENCOUNTER — Encounter: Payer: Self-pay | Admitting: Cardiology

## 2023-11-17 ENCOUNTER — Ambulatory Visit: Attending: Cardiology | Admitting: Cardiology

## 2023-11-17 VITALS — BP 110/72 | HR 63 | Ht 69.0 in | Wt 185.3 lb

## 2023-11-17 DIAGNOSIS — I340 Nonrheumatic mitral (valve) insufficiency: Secondary | ICD-10-CM

## 2023-11-17 DIAGNOSIS — I493 Ventricular premature depolarization: Secondary | ICD-10-CM

## 2023-11-17 DIAGNOSIS — Z5181 Encounter for therapeutic drug level monitoring: Secondary | ICD-10-CM | POA: Diagnosis not present

## 2023-11-17 DIAGNOSIS — Z79899 Other long term (current) drug therapy: Secondary | ICD-10-CM

## 2023-11-17 DIAGNOSIS — I10 Essential (primary) hypertension: Secondary | ICD-10-CM | POA: Diagnosis not present

## 2023-11-17 NOTE — Progress Notes (Signed)
 ` Cardiology Office Note:  .   Date:  11/17/2023  ID:  [place], DOB 1950-04-02, MRN [id number] PCP:  Addie Camellia CROME, MD  Former Cardiology Providers: NA [facility] Providers Cardiologist:  Madonna Large, DO , [facility] (established care 07/09/2021) Electrophysiologist:  None  Click to update primary MD,subspecialty MD or APP then REFRESH:1}    Chief Complaint  Patient presents with   Follow-up    Premature ventricular contractions, cardiac monitor and GXT results    History of Present Illness: .   Gabriel [place] is a 73 y.o. African-American male whose past medical history and cardiovascular risk factors includes: Premature ventricular contractions, aortic atherosclerosis, hypertension, mitral regurgitation, prediabetes, hyperlipidemia.   Patient was noted to have a PVC burden of approximately 5.1% on his recent Zio patch as well as episodes of NSVT.  He underwent a nuclear stress test which was reported to be intermediate risk with concerns for possible peri-infarct ischemia.  Shared decision was to undergone left heart catheterization which notes no obstructive coronary artery disease.  Given his premature ventricular contractions and BMP was checked to make sure he has no electrolyte abnormalities and he is on diuretics.  He also had a repeat cardiac monitor which noted a PVC burden of 6.1%.  He was started on flecainide  50 mg p.o. twice daily.  He underwent GXT in September 2025 and was noted to have normal sinus rhythm at baseline with frequent PVCs.  With stress no QT or QRS widening with PVCs persisted.  Recommended EP consult for evaluation of PVCs despite antiarrhythmics and AV nodal blocking agents.  Clinically remains asymptomatic. Denies anginal chest pain or heart failure symptoms.  No near-syncope or syncopal events.  Denies tired or fatigued. Continues to work as a Scientist, water quality.  Compliant with both AV nodal blocking agents as well as flecainide .  Review of Systems: .    Review of Systems  Cardiovascular:  Negative for chest pain, claudication, irregular heartbeat, leg swelling, near-syncope, orthopnea, palpitations, paroxysmal nocturnal dyspnea and syncope.  Respiratory:  Negative for shortness of breath.   Hematologic/Lymphatic: Negative for bleeding problem.    Studies Reviewed:   Echocardiogram: January 2012: LVEF 45-50%, grade 1 diastolic impairment, mild MR, mildly dilated right ventricle.   10/05/2021: Normal LV systolic function with visual EF 55-60%. Left ventricle cavity is normal in size. Normal left ventricular wall thickness. Normal global wall motion. Doppler evidence of grade I (impaired) diastolic dysfunction, elevated LAP.  Left atrial cavity is moderately dilated. 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 Mild (Grade I) mitral regurgitation. Moderate tricuspid regurgitation. Mild to moderate pulmonary hypertension. RVSP measures 41 mmHg. Mild pulmonic regurgitation.  Stress Testing: MPI October 2024: Findings consistent with infarction with peri-infarct ischemia, intermediate risk study. See report for additional details  GXT (flecainide  initiation) 11/10/2023  No ST deviation was noted.  Baseline rhythm NSR with frequent PVC;s  With stress no QT/QRS widening PVCls persisted with stress/recovery No significant NSVT  Normal hemodynamic response to stress Will refer to EP for further evaluation   Heart Cath 02/06/2023 No angiographic evidence of CAD LVEDP  19 mmHg Preserved LV systolic function  CT Cardiac Scoring: 07/2021 Total CAC 0 AU. Noncardiac findings:  7 mm right lower lobe pulmonary nodule, unchanged since 02/16/2018 and considered benign. Aortic Atherosclerosis (ICD10-I70.0).   Cardiac monitor (Zio Patch): October 23, 2022 -October 30, 2022 Dominant rhythm sinus.  Heart rate 45-156 bpm.  Avg HR 74 bpm. Total ventricular ectopic burden  5.1% (predominantly isolated beats). See  report for additional details  Cardiac monitor (Zio Patch): August 14, 2023 -August 28, 2023 Dominant rhythm sinus rhythm with bundle branch block.  Heart rate 44-222 bpm. Avg HR 74 bpm. No atrial fibrillation detected during the monitoring period. No sustained supraventricular tachycardia, ventricular tachycardia, high grade AV block, pauses (3 seconds or longer). Total supraventricular ectopic burden 1.6 % Total ventricular ectopic burden 6.1% Asymptomatic / auto triggered episodes of NSVT. Fastest episode was on 08/15/2023 at 9:10 PM, 6 beats, 3.1 seconds, max HR 222 bpm. Patient triggered events: 1. Normal sinus rhythm without sustained arrhythmia.   RADIOLOGY: N/A  Risk Assessment/Calculations:   NA   Labs:       Latest Ref Rng & Units 02/06/2023    9:15 AM 01/13/2023   10:22 AM  CBC  WBC 3.4 - 10.8 x10E3/uL 3.6  3.6   Hemoglobin 13.0 - 17.7 g/dL 85.2  86.0   Hematocrit 37.5 - 51.0 % 44.4  43.7   Platelets 150 - 450 x10E3/uL 254  258        Latest Ref Rng & Units 07/30/2023    4:29 PM 01/13/2023   10:22 AM 08/12/2022   10:44 AM  BMP  Glucose 70 - 99 mg/dL 85  96  90   BUN 8 - 27 mg/dL 13  7  10    Creatinine 0.76 - 1.27 mg/dL 8.79  8.90  8.81   BUN/Creat Ratio 10 - 24 11  6  8    Sodium 134 - 144 mmol/L 141  139  138   Potassium 3.5 - 5.2 mmol/L 4.3  4.2  4.1   Chloride 96 - 106 mmol/L 103  102  101   CO2 20 - 29 mmol/L 21  27  21    Calcium 8.6 - 10.2 mg/dL 9.4  9.7  9.7       Latest Ref Rng & Units 07/30/2023    4:29 PM 01/13/2023   10:22 AM 08/12/2022   10:44 AM  CMP  Glucose 70 - 99 mg/dL 85  96  90   BUN 8 - 27 mg/dL 13  7  10    Creatinine 0.76 - 1.27 mg/dL 8.79  8.90  8.81   Sodium 134 - 144 mmol/L 141  139  138   Potassium 3.5 - 5.2 mmol/L 4.3  4.2  4.1   Chloride 96 - 106 mmol/L 103  102  101   CO2 20 - 29 mmol/L 21  27  21    Calcium 8.6 - 10.2 mg/dL 9.4  9.7  9.7     No results found for: CHOL, HDL, LDLCALC, LDLDIRECT, TRIG, CHOLHDL No  results for input(s): LIPOA in the last 8760 hours. No components found for: NTPROBNP No results for input(s): PROBNP in the last 8760 hours. No results for input(s): TSH in the last 8760 hours.  External Labs: Collected: Jun 25, 2021 provided by PCP Total cholesterol 195, HDL 53, triglycerides 137, LDL 117, non-HDL 142 BUN 10, creatinine 0.99 Sodium 141, potassium 4, chloride 103, bicarb 29 AST 24, ALT 22, alkaline phosphatase 48 Hemoglobin 15 g/dL, hematocrit 54%   Collected: 02/02/2021 NT proBNP 27. Total cholesterol 211, HDL 63, triglycerides 98, LDL 128, non-HDL 148  Physical Exam:    Today's Vitals   11/17/23 0830  BP: 110/72  Pulse: 63  SpO2: 94%  Weight: 185 lb 4.8 oz (84.1 kg)  Height: 5' 9 (1.753 m)    Body mass index is 27.36 kg/m. Wt  Readings from Last 3 Encounters:  11/17/23 185 lb 4.8 oz (84.1 kg)  10/03/23 187 lb (84.8 kg)  07/30/23 187 lb 3.2 oz (84.9 kg)    Physical Exam  Constitutional: No distress.  hemodynamically stable  Neck: No JVD present.  Cardiovascular: Normal rate, regular rhythm, S1 normal and S2 normal. Occasional extrasystoles are present. Exam reveals no gallop, no S3 and no S4.  No murmur heard. Pulmonary/Chest: Effort normal and breath sounds normal. No stridor. He has no wheezes. He has no rales.  Musculoskeletal:        General: No edema.     Cervical back: Neck supple.  Skin: Skin is warm.   Impression & Recommendation(s):  Impression:   ICD-10-CM   1. Premature ventricular contraction  I49.3 Ambulatory referral to Cardiac Electrophysiology    2. Encounter for monitoring flecainide  therapy  Z51.81    Z79.899     3. Nonrheumatic mitral valve regurgitation  I34.0     4. Benign hypertension  I10         Recommendation(s):  Premature ventricular contraction Encounter for monitoring flecainide  therapy August/September 2024: PVC burden 5.1%. June/July 2025: PVC burden 6.1%. Currently on Cardizem  180 mg p.o.  daily. Currently on flecainide  50 mg p.o. twice daily. Will refer to EP for evaluation of PVC management despite being on AV nodal blocking agents as well as antiarrhythmics. GXT after flecainide  initiation was performed on 11/10/2023 results reviewed at today's visit. Independently reviewed labs from 6//2025. Cardiac monitor results reviewed with the patient at today's visit.  Nonrheumatic mitral valve regurgitation August 2024: Mild MR. Recommend 3 to 5-year follow-up study in an asymptomatic male.   Order still needs to be placed.  Patient aware of the plan  Benign hypertension Office and home blood pressures are well-controlled on current medical therapy. Continue hydrochlorothiazide 12.5 mg p.o. daily. Continue losartan  25 mg p.o. daily Reemphasized importance of low-salt diet.  Mixed hyperlipidemia Continue Crestor 5 mg p.o. every other day.  Orders Placed:  Orders Placed This Encounter  Procedures   Ambulatory referral to Cardiac Electrophysiology    Referral Priority:   Routine    Referral Type:   Consultation    Referral Reason:   Specialty Services Required    Requested Specialty:   Cardiology    Number of Visits Requested:   1    Final Medication List:    No orders of the defined types were placed in this encounter.   There are no discontinued medications.    Current Outpatient Medications:    acetaminophen (TYLENOL) 650 MG CR tablet, Take 1,300 mg by mouth daily., Disp: , Rfl:    azelastine (ASTELIN) 0.1 % nasal spray, Place 1 spray into both nostrils 2 (two) times daily. Use in each nostril as directed, Disp: , Rfl:    Cyanocobalamin (VITAMIN B-12 PO), Take 1 tablet by mouth daily., Disp: , Rfl:    diclofenac Sodium (VOLTAREN) 1 % GEL, Apply 2 g topically 4 (four) times daily as needed (arthritis pain)., Disp: , Rfl:    diltiazem  (CARDIZEM  CD) 180 MG 24 hr capsule, TAKE 1 CAPSULE BY MOUTH EVERY DAY, Disp: 90 capsule, Rfl: 2   finasteride (PROSCAR) 5 MG tablet,  Take 5 mg by mouth daily., Disp: , Rfl:    flecainide  (TAMBOCOR ) 50 MG tablet, Take 1 tablet (50 mg total) by mouth 2 (two) times daily., Disp: 180 tablet, Rfl: 3   GLUCOSAMINE PO, Take 1 tablet by mouth 2 (two) times daily., Disp: , Rfl:  hydrochlorothiazide (HYDRODIURIL) 12.5 MG tablet, Take 12.5 mg by mouth daily., Disp: , Rfl:    losartan  (COZAAR ) 25 MG tablet, Take 1 tablet (25 mg total) by mouth in the morning., Disp: 90 tablet, Rfl: 3   magnesium oxide (MAG-OX) 400 (240 Mg) MG tablet, Take 400 mg by mouth 2 (two) times daily as needed (cramping)., Disp: , Rfl:    omeprazole (PRILOSEC) 20 MG capsule, Take 20 mg by mouth daily. (Patient taking differently: Take 20 mg by mouth daily. Taking 40 mg once daily), Disp: , Rfl:    OVER THE COUNTER MEDICATION, Take 2 tablets by mouth daily. Osteo Bi-Flex (Glucosamine 1,500 mg, Joint Shield 5-LOXIN Advanced 100 mg, and Chondroitin/MSM Complex 1,103 mg), Disp: , Rfl:    QUEtiapine (SEROQUEL) 25 MG tablet, Take 25 mg by mouth at bedtime., Disp: , Rfl:    rosuvastatin (CRESTOR) 5 MG tablet, Take 5 mg by mouth every other day., Disp: , Rfl:    tadalafil (CIALIS) 5 MG tablet, Take 5 mg by mouth daily as needed (pt prefrenece)., Disp: , Rfl:    terazosin (HYTRIN) 5 MG capsule, Take 5 mg by mouth at bedtime., Disp: , Rfl:    TURMERIC PO, Take 1,000 mg by mouth daily., Disp: , Rfl:   Consent:   N/A  Disposition:   6 months or sooner if needed  His questions and concerns were addressed to his satisfaction. He voices understanding of the recommendations provided during this encounter.    Signed, Madonna Michele HAS, [facility]  A Division of [facility] [address], [place], [place] [address]  11/17/2023 8:58 AM

## 2023-11-17 NOTE — Patient Instructions (Signed)
 Medication Instructions:  No medication changes were made at this visit. Continue current regimen.   *If you need a refill on your cardiac medications before your next appointment, please call your pharmacy*  Lab Work: NONE If you have labs (blood work) drawn today and your tests are completely normal, you will receive your results only by: MyChart Message (if you have MyChart) OR A paper copy in the mail If you have any lab test that is abnormal or we need to change your treatment, we will call you to review the results.  Testing/Procedures: EP Consult Placed  Follow-Up: At [facility], you and your health needs are our priority.  As part of our continuing mission to provide you with exceptional heart care, our providers are all part of one team.  This team includes your primary Cardiologist (physician) and Advanced Practice Providers or APPs (Physician Assistants and Nurse Practitioners) who all work together to provide you with the care you need, when you need it.  Your next appointment:   6 Months  Provider:   Madonna Large, DO    We recommend signing up for the patient portal called MyChart.  Sign up information is provided on this After Visit Summary.  MyChart is used to connect with patients for Virtual Visits (Telemedicine).  Patients are able to view lab/test results, encounter notes, upcoming appointments, etc.  Non-urgent messages can be sent to your provider as well.   To learn more about what you can do with MyChart, go to [email or web address].

## 2023-12-22 ENCOUNTER — Ambulatory Visit: Admitting: Cardiology

## 2024-01-04 NOTE — Progress Notes (Unsigned)
  Electrophysiology Office Note:   Date:  01/04/2024  ID:  Gabriel Benson, DOB [birth year], MRN [id number]  Primary Cardiologist: Madonna Large, DO Primary Heart Failure: None Electrophysiologist: None  {Click to update primary MD,subspecialty MD or APP then REFRESH:1}    History of Present Illness:   Gabriel Benson is a 73 y.o. male with h/o PVCs, aortic valve sclerosis, hypertension, mitral regurgitation, prediabetes, hyperlipidemia seen today for  for Electrophysiology evaluation of PVCs at the request of Sunit Tolia.    Patient wore a ZIO monitor that showed a 5.1% PVC burden as well as episodes of nonsustained VT.  He underwent nuclear stress test which was intermediate risk with concerns of peri-infarct ischemia.  He had left heart catheterization which showed no coronary artery disease.  Repeat cardiac monitor showed a PVC burden of 6.1%.  He was started on flecainide .  GXT September 2025 showed sinus rhythm with frequent PVCs.  He had no QRS widening.  Review of systems complete and found to be negative unless listed in HPI.   EP Information / Studies Reviewed:    {EKGtoday:28818}        Risk Assessment/Calculations:            Physical Exam:   VS:  There were no vitals taken for this visit.   Wt Readings from Last 3 Encounters:  11/17/23 185 lb 4.8 oz (84.1 kg)  10/03/23 187 lb (84.8 kg)  07/30/23 187 lb 3.2 oz (84.9 kg)     GEN: Well nourished, well developed in no acute distress NECK: No JVD; No carotid bruits CARDIAC: {EPRHYTHM:28826}, no murmurs, rubs, gallops RESPIRATORY:  Clear to auscultation without rales, wheezing or rhonchi  ABDOMEN: Soft, non-tender, non-distended EXTREMITIES:  No edema; No deformity   ASSESSMENT AND PLAN:    1.  PVCs: 6.1% burden on cardiac monitor.  On flecainide  and diltiazem .***  2.  Nonrheumatic mitral regurgitation: Mild on most recent echo.  Plan per primary cardiology.  3.  Hypertension:***  4.  Hyperlipidemia: Continue  Crestor  Follow up with {EPMDS:28135::EP Team} {EPFOLLOW LE:71826}  Signed, Amen Staszak Gladis Norton, MD

## 2024-01-05 ENCOUNTER — Ambulatory Visit: Admitting: Cardiology

## 2024-01-05 ENCOUNTER — Encounter: Payer: Self-pay | Admitting: Cardiology

## 2024-01-05 VITALS — BP 101/63 | HR 65 | Ht 69.0 in | Wt 189.0 lb

## 2024-01-05 DIAGNOSIS — Z5181 Encounter for therapeutic drug level monitoring: Secondary | ICD-10-CM

## 2024-01-05 DIAGNOSIS — I10 Essential (primary) hypertension: Secondary | ICD-10-CM

## 2024-01-05 DIAGNOSIS — I493 Ventricular premature depolarization: Secondary | ICD-10-CM

## 2024-01-05 DIAGNOSIS — I4729 Other ventricular tachycardia: Secondary | ICD-10-CM

## 2024-01-05 DIAGNOSIS — Z79899 Other long term (current) drug therapy: Secondary | ICD-10-CM

## 2024-01-05 MED ORDER — FLECAINIDE ACETATE 100 MG PO TABS: 100.0000 mg | ORAL_TABLET | Freq: Two times a day (BID) | ORAL | 3 refills | Status: DC

## 2024-01-05 NOTE — Addendum Note (Signed)
 Addended by: CHAUVIGNE, Esaiah Wanless on: 01/05/2024 11:00 AM   Modules accepted: Orders

## 2024-01-05 NOTE — Patient Instructions (Addendum)
 Medication Instructions: Your physician has recommended you make the following change in your medication:  1) INCREASE flecainide  to 100 mg twice daily   *If you need a refill on your cardiac medications before your next appointment, please call your pharmacy*  Follow-Up: At [facility], you and your health needs are our priority.  As part of our continuing mission to provide you with exceptional heart care, our providers are all part of one team.  This team includes your primary Cardiologist (physician) and Advanced Practice Providers or APPs (Physician Assistants and Nurse Practitioners) who all work together to provide you with the care you need, when you need it.  Your next appointment:   Nurse visit - EKG in two weeks  3 months  Provider:   You will see one of the following Advanced Practice Providers on your designated Care Team:   Charlies Arthur, [place] Ozell Jodie Passey, PA-C Suzann Riddle, NP Daphne Barrack, NP Artist Pouch, PA-C

## 2024-01-07 ENCOUNTER — Telehealth: Payer: Self-pay

## 2024-01-07 NOTE — Telephone Encounter (Signed)
 Left message for patient to call back to schedule an EKG after increase in flecainide  on 11/10. Patient also needs a 3 month follow up visit.

## 2024-01-09 ENCOUNTER — Telehealth: Payer: Self-pay | Admitting: Cardiology

## 2024-01-09 NOTE — Telephone Encounter (Signed)
 Pt confirms he feels fine and having no symptoms.  Reports he is working out in the yard with no issues. Aware I am sending him information on how to send [employer] strips via mychart and to attempt to send for MD to review. Aware if unable to send will need to make an appt next week for an in office nurse visit EKG - pt agreeable to this if needed

## 2024-01-09 NOTE — Telephone Encounter (Signed)
 Dr. Addie noted patient had a new rhythm change last night and is following up on orders for patient to have a repeat EKG as discussed in last office.

## 2024-01-09 NOTE — Telephone Encounter (Signed)
 Pt recently had change in Flecainide  dose to 100 mg twice a day.  (11/10) was to have been scheduled for EKG 2 weeks.  Spoke with pt who reports this morning his [facility] stated his HR was 61 bpm, and that he had a widen QRS.  Pt does not know how to send a copy of the report.  He reports he has never had this message sent before on his Kardia.  Pt had seen Dr Addie this am for his 6 mn f/u and mentioned this to him but that office did not do an EKG.  Advised I will sent this information to MD and his nurse for review.   Pt has not been scheduled for EKG 2 weeks f/u flecainide  dose change but my need to come in sooner for EKG.

## 2024-01-14 NOTE — Telephone Encounter (Signed)
 Patient is requesting to speak with Dr. Inocencio nurse. Please advise.

## 2024-01-14 NOTE — Telephone Encounter (Signed)
 Patient called to follow-up to see if Dr. Inocencio had seen the EKG he sent through MyChart.   Per note from Dr. Carolyn nurse, unable to see EKG. Waiting for response on having patient come in for EKG.  Patient lives in [place] and would prefer to come to [place] office.   Will forward to Dr. Inocencio and his nurse.

## 2024-01-16 ENCOUNTER — Ambulatory Visit: Attending: Internal Medicine | Admitting: *Deleted

## 2024-01-16 VITALS — Ht 69.0 in | Wt 189.0 lb

## 2024-01-16 DIAGNOSIS — I4729 Other ventricular tachycardia: Secondary | ICD-10-CM

## 2024-01-19 ENCOUNTER — Other Ambulatory Visit: Payer: Self-pay | Admitting: Cardiology

## 2024-01-19 MED ORDER — MEXILETINE HCL 250 MG PO CAPS: 250.0000 mg | ORAL_CAPSULE | Freq: Two times a day (BID) | ORAL | 3 refills | Status: DC

## 2024-01-19 NOTE — Progress Notes (Signed)
 Pt made aware last week to stop Flecainide . Called today and advised to start Mexiletine 250 mg BID per Dr. Inocencio. Advised to call the office if he has any issues after starting the new medication. Patient verbalized understanding and agreeable to plan.   Will send medication information to pt via mychart.

## 2024-01-20 NOTE — Telephone Encounter (Signed)
Alternative Requested:NON FORMULARY. 

## 2024-01-21 ENCOUNTER — Other Ambulatory Visit (HOSPITAL_COMMUNITY): Payer: Self-pay

## 2024-01-21 ENCOUNTER — Telehealth: Payer: Self-pay | Admitting: Pharmacy Technician

## 2024-01-21 NOTE — Telephone Encounter (Signed)
 Pharmacy Patient Advocate Encounter  Received notification from [facility] that Prior Authorization for mexiletine  has been APPROVED from 01/21/24 to 02/24/25   PA #/Case ID/Reference #: [id number]

## 2024-01-21 NOTE — Telephone Encounter (Signed)
 Pharmacy Patient Advocate Encounter   Received notification from Physician's Office that prior authorization for mexiletine is required/requested.   Insurance verification completed.   The patient is insured through [facility].   Per test claim: PA required; PA submitted to above mentioned insurance via Latent Key/confirmation #/EOC [id number] Status is pending

## 2024-02-10 ENCOUNTER — Telehealth: Payer: Self-pay | Admitting: Cardiology

## 2024-02-10 MED ORDER — MEXILETINE HCL 250 MG PO CAPS: 250.0000 mg | ORAL_CAPSULE | Freq: Two times a day (BID) | ORAL | 3 refills | Status: AC

## 2024-02-10 NOTE — Telephone Encounter (Signed)
 Patient calling in about new medication (couldn't remember the name). Please advise

## 2024-02-10 NOTE — Telephone Encounter (Signed)
 S/w the patient and informed that the PA was approved. The medication was sent to his pharmacy 01/19/24. Sent the RX again, just in case it was shelved after not being picked up. Asked pt to call us  back if he has any questions/concerns. He verbalized understanding.

## 2024-04-21 ENCOUNTER — Ambulatory Visit: Admitting: Cardiology

## 2024-05-20 ENCOUNTER — Ambulatory Visit: Admitting: Cardiology
